# Patient Record
Sex: Male | Born: 1935 | Race: White | Hispanic: No | Marital: Married | Smoking: Former smoker
Health system: Southern US, Community
[De-identification: ages and names within clinical notes are randomized; demographics above are authoritative.]

## PROBLEM LIST (undated history)

## (undated) DIAGNOSIS — Z87448 Personal history of other diseases of urinary system: Secondary | ICD-10-CM

## (undated) DIAGNOSIS — D494 Neoplasm of unspecified behavior of bladder: Secondary | ICD-10-CM

## (undated) DIAGNOSIS — C61 Malignant neoplasm of prostate: Secondary | ICD-10-CM

## (undated) DIAGNOSIS — Z96649 Presence of unspecified artificial hip joint: Secondary | ICD-10-CM

## (undated) DIAGNOSIS — I509 Heart failure, unspecified: Secondary | ICD-10-CM

## (undated) DIAGNOSIS — M1990 Unspecified osteoarthritis, unspecified site: Secondary | ICD-10-CM

## (undated) HISTORY — DX: Unspecified osteoarthritis, unspecified site: M19.90

## (undated) HISTORY — PX: HERNIA REPAIR: SHX51

## (undated) HISTORY — DX: Presence of unspecified artificial hip joint: Z96.649

## (undated) HISTORY — DX: Heart failure, unspecified: I50.9

## (undated) HISTORY — DX: Personal history of other diseases of urinary system: Z87.448

## (undated) HISTORY — DX: Neoplasm of unspecified behavior of bladder: D49.4

## (undated) HISTORY — PX: TOTAL HIP ARTHROPLASTY: SHX124

---

## 1962-10-08 DIAGNOSIS — Z96649 Presence of unspecified artificial hip joint: Secondary | ICD-10-CM

## 1962-10-08 HISTORY — DX: Presence of unspecified artificial hip joint: Z96.649

## 2013-11-04 ENCOUNTER — Ambulatory Visit: Payer: Self-pay | Admitting: Internal Medicine

## 2013-11-10 ENCOUNTER — Encounter: Payer: Self-pay | Admitting: Internal Medicine

## 2013-11-10 ENCOUNTER — Ambulatory Visit (INDEPENDENT_AMBULATORY_CARE_PROVIDER_SITE_OTHER): Payer: Medicare HMO | Admitting: Internal Medicine

## 2013-11-10 VITALS — BP 144/80 | HR 80 | Temp 98.0°F | Ht 67.0 in | Wt 132.0 lb

## 2013-11-10 DIAGNOSIS — N400 Enlarged prostate without lower urinary tract symptoms: Secondary | ICD-10-CM

## 2013-11-10 DIAGNOSIS — M1990 Unspecified osteoarthritis, unspecified site: Secondary | ICD-10-CM | POA: Insufficient documentation

## 2013-11-10 DIAGNOSIS — R21 Rash and other nonspecific skin eruption: Secondary | ICD-10-CM

## 2013-11-10 DIAGNOSIS — M129 Arthropathy, unspecified: Secondary | ICD-10-CM

## 2013-11-10 DIAGNOSIS — Z96649 Presence of unspecified artificial hip joint: Secondary | ICD-10-CM

## 2013-11-10 MED ORDER — CICLOPIROX OLAMINE 0.77 % EX CREA: TOPICAL_CREAM | Freq: Two times a day (BID) | CUTANEOUS | Status: DC
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
## 2013-11-10 NOTE — Patient Instructions (Addendum)
Would like dermatology to see you about the rash but you can decide this[facility]   In this office park is helpful. If you change your mind  Can get there prevnar 13  Vaccine.   Will refill medication  Topical   Get a copy of last 2 years of  Office notes labs studies  medical records and any immunizations done .    Fall Prevention and Home Safety Falls cause injuries and can affect all age groups. It is possible to use preventive measures to significantly decrease the likelihood of falls. There are many simple measures which can make your home safer and prevent falls. OUTDOORS  Repair cracks and edges of walkways and driveways.  Remove high doorway thresholds.  Trim shrubbery on the main path into your home.  Have good outside lighting.  Clear walkways of tools, rocks, debris, and clutter.  Check that handrails are not broken and are securely fastened. Both sides of steps should have handrails.  Have leaves, snow, and ice cleared regularly.  Use sand or salt on walkways during winter months.  In the garage, clean up grease or oil spills. BATHROOM  Install night lights.  Install grab bars by the toilet and in the tub and shower.  Use non-skid mats or decals in the tub or shower.  Place a plastic non-slip stool in the shower to sit on, if needed.  Keep floors dry and clean up all water on the floor immediately.  Remove soap buildup in the tub or shower on a regular basis.  Secure bath mats with non-slip, double-sided rug tape.  Remove throw rugs and tripping hazards from the floors. BEDROOMS  Install night lights.  Make sure a bedside light is easy to reach.  Do not use oversized bedding.  Keep a telephone by your bedside.  Have a firm chair with side arms to use for getting dressed.  Remove throw rugs and tripping hazards from the floor. KITCHEN  Keep handles on pots and pans turned toward the center of the stove. Use back burners when  possible.  Clean up spills quickly and allow time for drying.  Avoid walking on wet floors.  Avoid hot utensils and knives.  Position shelves so they are not too high or low.  Place commonly used objects within easy reach.  If necessary, use a sturdy step stool with a grab bar when reaching.  Keep electrical cables out of the way.  Do not use floor polish or wax that makes floors slippery. If you must use wax, use non-skid floor wax.  Remove throw rugs and tripping hazards from the floor. STAIRWAYS  Never leave objects on stairs.  Place handrails on both sides of stairways and use them. Fix any loose handrails. Make sure handrails on both sides of the stairways are as long as the stairs.  Check carpeting to make sure it is firmly attached along stairs. Make repairs to worn or loose carpet promptly.  Avoid placing throw rugs at the top or bottom of stairways, or properly secure the rug with carpet tape to prevent slippage. Get rid of throw rugs, if possible.  Have an electrician put in a light switch at the top and bottom of the stairs. OTHER FALL PREVENTION TIPS  Wear low-heel or rubber-soled shoes that are supportive and fit well. Wear closed toe shoes.  When using a stepladder, make sure it is fully opened and both spreaders are firmly locked. Do not climb a closed stepladder.  Add color or contrast paint or tape to grab bars and handrails in your home. Place contrasting color strips on first and last steps.  Learn and use mobility aids as needed. Install an electrical emergency response system.  Turn on lights to avoid dark areas. Replace light bulbs that burn out immediately. Get light switches that glow.  Arrange furniture to create clear pathways. Keep furniture in the same place.  Firmly attach carpet with non-skid or double-sided tape.  Eliminate uneven floor surfaces.  Select a carpet pattern that does not visually hide the edge of steps.  Be aware of all  pets. OTHER HOME SAFETY TIPS  Set the water temperature for 120 F (48.8 C).  Keep emergency numbers on or near the telephone.  Keep smoke detectors on every level of the home and near sleeping areas. Document Released: 09/14/2002 Document Revised: 03/25/2012 Document Reviewed: 12/14/2011 [facility] Patient Information 2014 [place].

## 2014-02-19 ENCOUNTER — Telehealth: Payer: Self-pay

## 2014-02-19 ENCOUNTER — Encounter: Payer: Self-pay | Admitting: Physician Assistant

## 2014-02-19 ENCOUNTER — Ambulatory Visit (INDEPENDENT_AMBULATORY_CARE_PROVIDER_SITE_OTHER): Payer: Commercial Managed Care - HMO | Admitting: Physician Assistant

## 2014-02-19 VITALS — BP 110/80 | HR 91 | Temp 97.6°F | Resp 18 | Wt 128.0 lb

## 2014-02-19 DIAGNOSIS — L0390 Cellulitis, unspecified: Secondary | ICD-10-CM

## 2014-02-19 DIAGNOSIS — L0291 Cutaneous abscess, unspecified: Secondary | ICD-10-CM

## 2014-02-19 MED ORDER — DOXYCYCLINE HYCLATE 100 MG PO CAPS: 100.0000 mg | ORAL_CAPSULE | Freq: Two times a day (BID) | ORAL | Status: DC
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
## 2014-02-19 NOTE — Patient Instructions (Signed)
Doxycycline twice daily for 10 days due to pustule presence with cellulitis.  Follow up in 1 week, or sooner if symptoms worsen or fail to improve despite treatment.   Cellulitis Cellulitis is an infection of the skin and the tissue under the skin. The infected area is usually red and tender. This happens most often in the arms and lower legs. HOME CARE   Take your antibiotic medicine as told. Finish the medicine even if you start to feel better.  Keep the infected arm or leg raised (elevated).  Put a warm cloth on the area up to 4 times per day.  Only take medicines as told by your doctor.  Keep all doctor visits as told. GET HELP RIGHT AWAY IF:   You have a fever.  You feel very sleepy.  You throw up (vomit) or have watery poop (diarrhea).  You feel sick and have muscle aches and pains.  You see red streaks on the skin coming from the infected area.  Your red area gets bigger or turns a dark color.  Your bone or joint under the infected area is painful after the skin heals.  Your infection comes back in the same area or different area.  You have a puffy (swollen) bump in the infected area.  You have new symptoms. MAKE SURE YOU:   Understand these instructions.  Will watch your condition.  Will get help right away if you are not doing well or get worse. Document Released: 03/12/2008 Document Revised: 03/25/2012 Document Reviewed: 12/10/2011 [facility] Patient Information 2014 [place], [place].

## 2014-02-19 NOTE — Telephone Encounter (Signed)
Pt's daughter and wife came into office about pt's medication.  Per daughter they went to [facility] [address] and was told that the medication was not there [facility] [place]. Called [facility] [place] and spoke with Otila Kluver and was told that pt's medication was ready but it was self pay until pt's new insurance card was received.  Spoke with daughter and wife and they are aware.   Pt's daughter states that pt is still concerned about the cellulitis on his leg; it is concerned that there is a tick in there and they would like a call from [place].  Pls call Pamala Hurry.

## 2014-02-19 NOTE — Telephone Encounter (Signed)
Called, Spoke with daughter. She understands the current plan and agrees with it. They will follow up next week for a recheck.

## 2014-02-19 NOTE — Progress Notes (Signed)
Pre visit review using our clinic review tool, if applicable. No additional management support is needed unless otherwise documented below in the visit note. 

## 2014-02-23 ENCOUNTER — Encounter: Payer: Self-pay | Admitting: Internal Medicine

## 2014-02-23 ENCOUNTER — Ambulatory Visit (INDEPENDENT_AMBULATORY_CARE_PROVIDER_SITE_OTHER): Payer: Commercial Managed Care - HMO | Admitting: Internal Medicine

## 2014-02-23 VITALS — BP 136/76 | Temp 97.6°F | Ht 67.0 in | Wt 131.0 lb

## 2014-02-23 DIAGNOSIS — R21 Rash and other nonspecific skin eruption: Secondary | ICD-10-CM

## 2014-02-23 DIAGNOSIS — L0291 Cutaneous abscess, unspecified: Secondary | ICD-10-CM

## 2014-02-23 DIAGNOSIS — N400 Enlarged prostate without lower urinary tract symptoms: Secondary | ICD-10-CM

## 2014-02-23 DIAGNOSIS — L0390 Cellulitis, unspecified: Secondary | ICD-10-CM

## 2014-02-23 MED ORDER — TAMSULOSIN HCL 0.4 MG PO CAPS: 0.4000 mg | ORAL_CAPSULE | Freq: Two times a day (BID) | ORAL | Status: DC
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
## 2014-02-23 NOTE — Patient Instructions (Addendum)
The skin infection looks a lot better  Ok to take with food if needed. And plenty of water to avoid stomach upset.  Still uncertain  the skin area .    On the back   Would like you to see .  Dermatology to make sure no early cancers in on your back.  Advise  You to get prevnar 13 .,   Ok to do.  urology referral. If needed    For your prostate problem and medication.  Since  No significant sx we can continue medication at this time.   ROV 6 months

## 2014-03-15 ENCOUNTER — Telehealth: Payer: Self-pay | Admitting: Family Medicine

## 2014-03-15 DIAGNOSIS — R413 Other amnesia: Secondary | ICD-10-CM

## 2014-03-15 NOTE — Telephone Encounter (Signed)
Spoke to [place] (daughter).  She reports the patient has been having a lot of memory issues.  She can have a conversation with him and within 10 minutes he has forgotten what they talked about.  She says he has no interest in doing things.  The things he use to do during the day, he no longer does.  Mostly just sits around.  She said he has problems making decisions.  Even small ones.  She would like to know if this could be due to the medications that he is one or is this something else.  Please advise.  Thanks!

## 2014-03-15 NOTE — Telephone Encounter (Signed)
I don't think its the medicine . Depression can do this or other brain problems .  Advise we get neurology consult

## 2014-03-16 NOTE — Telephone Encounter (Signed)
Spoke to [place].  Advised that Dr. Regis Bill does not think it is his medications and she would like for him to be seen by neuro. Pamala Hurry agrees.  Will place referral.

## 2014-03-18 ENCOUNTER — Telehealth: Payer: Self-pay | Admitting: Internal Medicine

## 2014-03-18 NOTE — Telephone Encounter (Signed)
Pt's daughter Thuan Tippett) calling on behalf of pt to have appt to neurologist rescheduled.  Apparently, the patient cancelled the appt when the office call to inform him of the date and time.  Daughter was given the number to [facility] to reschedule appt.  Ms. Teal wants pt's PCP to be aware of this.

## 2014-03-19 ENCOUNTER — Ambulatory Visit (INDEPENDENT_AMBULATORY_CARE_PROVIDER_SITE_OTHER): Payer: Commercial Managed Care - HMO | Admitting: Internal Medicine

## 2014-03-19 ENCOUNTER — Encounter: Payer: Self-pay | Admitting: Internal Medicine

## 2014-03-19 VITALS — BP 154/74 | Temp 98.2°F | Wt 130.0 lb

## 2014-03-19 DIAGNOSIS — T148 Other injury of unspecified body region: Secondary | ICD-10-CM

## 2014-03-19 DIAGNOSIS — W57XXXA Bitten or stung by nonvenomous insect and other nonvenomous arthropods, initial encounter: Secondary | ICD-10-CM

## 2014-03-19 DIAGNOSIS — Z23 Encounter for immunization: Secondary | ICD-10-CM

## 2014-03-19 MED ORDER — DOXYCYCLINE HYCLATE 100 MG PO TABS: 100.0000 mg | ORAL_TABLET | Freq: Two times a day (BID) | ORAL | Status: DC

## 2014-03-19 NOTE — Patient Instructions (Addendum)
This looks like an insect  Bite   Not typical of   Tick bite . Although you may have had a tick bite . This is not a deer tick dont think this is lyme  If you get   summer flu sx   With fever.   Seek emergent  Care .    If the  The lesion gets worse  Pain and swelling  Then add the antibiotic again . Update your tetanus shot today .  Spider Bite Spider bites are not common. Most spider bites do not cause serious problems. The elderly, very young children, and people with certain existing medical conditions are more likely to experience significant symptoms. SYMPTOMS  Spider bites may not cause any pain at first. Within 1 or 2 days of the bite, there may be swelling, redness, and pain in the bite area. However, some spider bites can cause pain within the first hour. TREATMENT  Your caregiver may prescribe antibiotic medicine if a bacterial infection develops in the bite. However, not all spider bites require antibiotics or prescription medicines.  HOME CARE INSTRUCTIONS  Do not scratch the bite area.  Keep the bite area clean and dry. Wash the area with soap and water as directed.  Put ice or cool compresses on the bite area.  Put ice in a plastic bag.  Place a towel between your skin and the bag.  Leave the ice on for 20 minutes, 4 times a day for the first 2 to 3 days, or as directed.  Keep the bite area elevated above the level of your heart. This helps reduce redness and swelling.  Only take over-the-counter or prescription medicines as directed by your caregiver.  If you are given antibiotics, take them as directed. Finish them even if you start to feel better. You may need a tetanus shot if:  You cannot remember when you had your last tetanus shot.  You have never had a tetanus shot.  The injury broke your skin. If you get a tetanus shot, your arm may swell, get red, and feel warm to the touch. This is common and not a problem. If you need a tetanus shot and you choose not  to have one, there is a rare chance of getting tetanus. Sickness from tetanus can be serious. SEEK MEDICAL CARE IF: Your bite is not better after 3 days of treatment. SEEK IMMEDIATE MEDICAL CARE IF:  Your bite turns purple or develops increased swelling, pain, or redness.  You develop shortness of breath or chest pain.  You have muscle cramps or painful muscle spasms.  You develop abdominal pain, nausea, or vomiting.  You feel unusually tired or sleepy. MAKE SURE YOU:  Understand these instructions.  Will watch your condition.  Will get help right away if you are not doing well or get worse. Document Released: 11/01/2004 Document Revised: 12/17/2011 Document Reviewed: 04/25/2011 [facility] Patient Information 2014 [place].  Tick Bite Information Ticks are insects that attach themselves to the skin and draw blood for food. There are various types of ticks. Common types include wood ticks and deer ticks. Most ticks live in shrubs and grassy areas. Ticks can climb onto your body when you make contact with leaves or grass where the tick is waiting. The most common places on the body for ticks to attach themselves are the scalp, neck, armpits, waist, and groin. Most tick bites are harmless, but sometimes ticks carry germs that cause diseases. These germs can be spread  to a person during the tick's feeding process. The chance of a disease spreading through a tick bite depends on:   The type of tick.  Time of year.   How long the tick is attached.   Geographic location.  HOW CAN YOU PREVENT TICK BITES? Take these steps to help prevent tick bites when you are outdoors:  Wear protective clothing. Long sleeves and long pants are best.   Wear white clothes so you can see ticks more easily.  Tuck your pant legs into your socks.   If walking on a trail, stay in the middle of the trail to avoid brushing against bushes.  Avoid walking through areas with long grass.  Put  insect repellent on all exposed skin and along boot tops, pant legs, and sleeve cuffs.   Check clothing, hair, and skin repeatedly and before going inside.   Brush off any ticks that are not attached.  Take a shower or bath as soon as possible after being outdoors.  WHAT IS THE PROPER WAY TO REMOVE A TICK? Ticks should be removed as soon as possible to help prevent diseases caused by tick bites. 1. If latex gloves are available, put them on before trying to remove a tick.  2. Using fine-point tweezers, grasp the tick as close to the skin as possible. You may also use curved forceps or a tick removal tool. Grasp the tick as close to its head as possible. Avoid grasping the tick on its body. 3. Pull gently with steady upward pressure until the tick lets go. Do not twist the tick or jerk it suddenly. This may break off the tick's head or mouth parts. 4. Do not squeeze or crush the tick's body. This could force disease-carrying fluids from the tick into your body.  5. After the tick is removed, wash the bite area and your hands with soap and water or other disinfectant such as alcohol. 6. Apply a small amount of antiseptic cream or ointment to the bite site.  7. Wash and disinfect any instruments that were used.  Do not try to remove a tick by applying a hot match, petroleum jelly, or fingernail polish to the tick. These methods do not work and may increase the chances of disease being spread from the tick bite.  WHEN SHOULD YOU SEEK MEDICAL CARE? Contact your health care provider if you are unable to remove a tick from your skin or if a part of the tick breaks off and is stuck in the skin.  After a tick bite, you need to be aware of signs and symptoms that could be related to diseases spread by ticks. Contact your health care provider if you develop any of the following in the days or weeks after the tick bite:  Unexplained fever.  Rash. A circular rash that appears days or weeks after  the tick bite may indicate the possibility of Lyme disease. The rash may resemble a target with a bull's-eye and may occur at a different part of your body than the tick bite.  Redness and swelling in the area of the tick bite.   Tender, swollen lymph glands.   Diarrhea.   Weight loss.   Cough.   Fatigue.   Muscle, joint, or bone pain.   Abdominal pain.   Headache.   Lethargy or a change in your level of consciousness.  Difficulty walking or moving your legs.   Numbness in the legs.   Paralysis.  Shortness of breath.  Confusion.   Repeated vomiting.  Document Released: 09/21/2000 Document Revised: 07/15/2013 Document Reviewed: 03/04/2013 [facility] Patient Information 2014 [place].
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
## 2014-08-02 ENCOUNTER — Telehealth: Payer: Self-pay | Admitting: Internal Medicine

## 2014-08-02 DIAGNOSIS — R413 Other amnesia: Secondary | ICD-10-CM

## 2014-08-02 NOTE — Telephone Encounter (Signed)
Order placed in the sytem. 

## 2014-08-02 NOTE — Telephone Encounter (Signed)
Per pt  Daughter call need a new referral for Memory problem Procedure: REF46 - AMB REFERRAL TO NEUROLOGY Back in June the patient did not want to schedule at that time  , now he does need new referral - the old one expired

## 2014-08-24 ENCOUNTER — Ambulatory Visit: Payer: Commercial Managed Care - HMO | Admitting: Internal Medicine

## 2014-09-07 ENCOUNTER — Telehealth: Payer: Self-pay | Admitting: Neurology

## 2014-09-07 NOTE — Telephone Encounter (Signed)
Pt called and canceled appt new patient appt and did not resch and does not feel like he needs to come in dr Regis Bill was notified

## 2014-09-08 ENCOUNTER — Ambulatory Visit: Payer: Commercial Managed Care - HMO | Admitting: Neurology

## 2014-12-13 ENCOUNTER — Telehealth: Payer: Self-pay | Admitting: Internal Medicine

## 2014-12-13 MED ORDER — FINASTERIDE 5 MG PO TABS: 5.0000 mg | ORAL_TABLET | Freq: Every day | ORAL | Status: DC

## 2014-12-13 NOTE — Telephone Encounter (Signed)
Ok to refill x 6 months 

## 2014-12-13 NOTE — Telephone Encounter (Signed)
Sent to the pharmacy by e-scribe. 

## 2014-12-13 NOTE — Telephone Encounter (Signed)
Pt needs new rx finasteride 5 mg #90 w/refills sent to [facility] order. This med was last prescribe by his last md

## 2015-01-31 ENCOUNTER — Telehealth: Payer: Self-pay | Admitting: Internal Medicine

## 2015-01-31 DIAGNOSIS — Z7689 Persons encountering health services in other specified circumstances: Secondary | ICD-10-CM

## 2015-01-31 NOTE — Telephone Encounter (Signed)
Referral placed in the system. 

## 2015-01-31 NOTE — Telephone Encounter (Addendum)
Pt would like to referral to dr Barkley Bruns [phone] for toenail cutting and callus on foot. Can we refer? Pt has [employer]

## 2015-02-28 ENCOUNTER — Encounter: Payer: Self-pay | Admitting: Podiatry

## 2015-02-28 ENCOUNTER — Ambulatory Visit (INDEPENDENT_AMBULATORY_CARE_PROVIDER_SITE_OTHER): Payer: Commercial Managed Care - HMO | Admitting: Podiatry

## 2015-02-28 VITALS — BP 131/67 | HR 87 | Temp 99.0°F | Resp 16

## 2015-02-28 DIAGNOSIS — Q828 Other specified congenital malformations of skin: Secondary | ICD-10-CM

## 2015-02-28 DIAGNOSIS — M2040 Other hammer toe(s) (acquired), unspecified foot: Secondary | ICD-10-CM | POA: Diagnosis not present

## 2015-02-28 NOTE — Progress Notes (Signed)
   Subjective:    Patient ID: Sergio Dennis, male    DOB: 1925-05-18, 81 y.o.   MRN: [id number]  HPI  N: Dull L: Right bottom foot D: 6+ months O: Sudden C: more irritating than anything A: When walking or certain shoes T: Nothing   Review of Systems  All other systems reviewed and are negative.      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 Debridement of keratoses 4 without any bleeding Attached felt metatarsal pads in patient's shoe insoles to see if this would would provide some relief in the MPJ area If this provider relief I recommended over-the-counter soft insole with a metatarsal raise He also requested handicap parking sticker at discharged and provided him a request for sticker 12 months based on painful plantar keratoses making walking uncomfortable at times After patient was discharged he requested nail debridement. I advised him to have nails trimmed at pedicurist  Reappoint at patient's request

## 2015-02-28 NOTE — Patient Instructions (Signed)
First wear the shoes with a metatarsal raises see if you can feel the difference Okay to try over-the-counter soft flexible shoe insole with soft metatarsal raise  Hammer Toes Hammer toes is a condition in which one or more of your toes is permanently flexed. CAUSES  This happens when a muscle imbalance or abnormal bone length makes your small toes buckle. This causes the toe joint to contract and the strong cord-like bands that attach muscles to the bones (tendons) in your toes to shorten.  SIGNS AND SYMPTOMS  Common symptoms of flexible hammer toes include:   A buildup of skin cells (corns). Corns occur where boney bumps come in frequent contact with hard surfaces. For example, where your shoes press and rub.  Irritation.  Inflammation.  Pain.  Limited motion in your toes. DIAGNOSIS  Hammer toes are diagnosed through a physical exam of your toes. During the exam, your health care provider may try to reproduce your symptoms by manipulating your foot. Often, X-ray exams are done to determine the degree of deformity and to make sure that the cause is not a fracture.  TREATMENT  Hammer toes can be treated with corrective surgery. There are several types of surgical procedures that can treat hammer toes. The most common procedures include:  Arthroplasty--A portion of the joint is surgically removed and your toe is straightened. The gap fills in with fibrous tissue. This procedure helps treat pain and deformity and helps restore function.  Fusion--Cartilage between the two bones of the affected joint is taken out and the bones fuse together into one longer bone. This helps keep your toe stable and reduces pain but leaves your toe stiff, yet straight.  Implantation--A portion of your bone is removed and replaced with an implant to restore motion.  Flexor tendon transfers--This procedure repositions the tendons that curl the toes down (flexor tendons). This may be done to release the  deforming force that causes your toe to buckle. Several of these procedures require fixing your toe with a pin that is visible at the tip of your toe. The pin keeps the toe straight during healing. Your health care provider will remove the pin usually within 4-8 weeks after the procedure.  Document Released: 09/21/2000 Document Revised: 09/29/2013 Document Reviewed: 06/01/2013 [facility] Patient Information 2015 [place], [place]. This information is not intended to replace advice given to you by your health care provider. Make sure you discuss any questions you have with your health care provider.

## 2015-03-01 ENCOUNTER — Encounter: Payer: Self-pay | Admitting: Podiatry

## 2015-03-08 ENCOUNTER — Telehealth: Payer: Self-pay | Admitting: Internal Medicine

## 2015-03-08 NOTE — Telephone Encounter (Signed)
Noted FYI to [facility]

## 2015-03-08 NOTE — Telephone Encounter (Signed)
[facility] - Client [place] [facility] Patient Name: Sergio Dennis DOB: [birth year] Initial Comment Caller states father has a rash all over and lips and checks are swollen. Allergic reaction to salmon. Nurse Assessment Nurse: Donalynn Furlong, RN, Myna Hidalgo Date/Time Eilene Ghazi Time): 03/08/2015 4:09:52 PM Confirm and document reason for call. If symptomatic, describe symptoms. ---Caller states father has a rash all over and lips and checks are swollen. Allergic reaction to salmon. This happened Saturday night. No difficulty breathing or swallowing Saturday night or today. Apparently, according to the caller, the swelling today is 1/2 of what is was on Sunday morning, and getting increasingly better. "My father is doing much better now than he was this weekend". Pt is comfortable, speaking, swallowing and breathing with ease. no other s/s to report at this time. Daughter has given pt Benedryl today, "just in case". Has the patient traveled out of the country within the last 30 days? ---No Does the patient require triage? ---Yes Related visit to physician within the last 2 weeks? ---No Does the PT have any chronic conditions? (i.e. diabetes, asthma, etc.) ---No Guidelines Guideline Title Affirmed Question Affirmed Notes Face Swelling Mild facial swelling of unknown cause (all triage questions negative) Final Disposition User [place], RN, Myna Hidalgo

## 2015-03-09 ENCOUNTER — Ambulatory Visit (INDEPENDENT_AMBULATORY_CARE_PROVIDER_SITE_OTHER): Payer: Commercial Managed Care - HMO | Admitting: Family Medicine

## 2015-03-09 ENCOUNTER — Ambulatory Visit: Payer: Commercial Managed Care - HMO | Admitting: Internal Medicine

## 2015-03-09 ENCOUNTER — Encounter: Payer: Self-pay | Admitting: Family Medicine

## 2015-03-09 VITALS — BP 116/82 | HR 95 | Temp 98.1°F | Ht 67.0 in | Wt 129.9 lb

## 2015-03-09 DIAGNOSIS — T783XXA Angioneurotic edema, initial encounter: Secondary | ICD-10-CM | POA: Diagnosis not present

## 2015-03-09 DIAGNOSIS — T7840XA Allergy, unspecified, initial encounter: Secondary | ICD-10-CM

## 2015-03-09 MED ORDER — PREDNISONE 20 MG PO TABS: 20.0000 mg | ORAL_TABLET | Freq: Every day | ORAL | Status: DC

## 2015-03-09 NOTE — Progress Notes (Signed)
Pre visit review using our clinic review tool, if applicable. No additional management support is needed unless otherwise documented below in the visit note. 
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
## 2015-03-09 NOTE — Patient Instructions (Addendum)
Take the prednisone once daily  Pepcid (available over the counter) once daily until you see the allergist  We placed a referral for you as discussed to the allergist. Please ensure you keep this appointment.  Schedule follow up with Dr. Regis Bill  in 2-4 weeks or sooner if any concerns  If ANY recurrent facial swelling, trouble breathing, trouble swallowing hives with nausea, vomiting or diarrhea or recurrent serious reacion seek emergency care immediately - call 911

## 2015-03-14 ENCOUNTER — Other Ambulatory Visit: Payer: Self-pay | Admitting: Internal Medicine

## 2015-03-16 NOTE — Telephone Encounter (Signed)
Sent to the pharmacy by e-scribe.  Pt has upcoming wellness on 06/08/15.

## 2015-04-13 ENCOUNTER — Telehealth: Payer: Self-pay | Admitting: Internal Medicine

## 2015-04-13 DIAGNOSIS — R4189 Other symptoms and signs involving cognitive functions and awareness: Secondary | ICD-10-CM

## 2015-04-13 DIAGNOSIS — R413 Other amnesia: Secondary | ICD-10-CM

## 2015-04-13 NOTE — Telephone Encounter (Signed)
daughter Pamala Hurry called to get referral for pt to see neurologist.  Pt has finally decided he will go . They could not get him in the past. pls call Pamala Hurry, as she is on the [facility].

## 2015-04-13 NOTE — Telephone Encounter (Signed)
Left a message on listed number for Sergio Dennis to return my call.

## 2015-04-14 NOTE — Telephone Encounter (Signed)
Pt has agreed to finally see a neurologist to be evaluated for dementia.  Sergio Dennis states the pt is getting worse.  Okay to place referral?

## 2015-04-14 NOTE — Telephone Encounter (Signed)
Left a message for Barbara to return my call.

## 2015-04-15 NOTE — Telephone Encounter (Signed)
Referral placed in the system. 

## 2015-04-15 NOTE — Telephone Encounter (Signed)
yes

## 2015-05-05 ENCOUNTER — Encounter: Payer: Self-pay | Admitting: Internal Medicine

## 2015-05-05 ENCOUNTER — Ambulatory Visit (INDEPENDENT_AMBULATORY_CARE_PROVIDER_SITE_OTHER): Payer: Commercial Managed Care - HMO | Admitting: Internal Medicine

## 2015-05-05 VITALS — BP 132/70 | Temp 97.5°F | Ht 67.0 in | Wt 130.6 lb

## 2015-05-05 DIAGNOSIS — M25471 Effusion, right ankle: Secondary | ICD-10-CM | POA: Diagnosis not present

## 2015-05-05 DIAGNOSIS — R2689 Other abnormalities of gait and mobility: Secondary | ICD-10-CM | POA: Diagnosis not present

## 2015-05-05 DIAGNOSIS — M25571 Pain in right ankle and joints of right foot: Secondary | ICD-10-CM

## 2015-05-05 DIAGNOSIS — L089 Local infection of the skin and subcutaneous tissue, unspecified: Secondary | ICD-10-CM

## 2015-05-05 MED ORDER — DOXYCYCLINE HYCLATE 100 MG PO TABS: 100.0000 mg | ORAL_TABLET | Freq: Two times a day (BID) | ORAL | Status: DC

## 2015-05-05 NOTE — Patient Instructions (Addendum)
Uncertain what caused this . But concern about infection    Subtle  injury . Get x ray and begin antibiotic   As directed   If not a lot better   In the next 5-7  days  Want you to see orthopedist or  Sports medicine .   Perhaps Dr.  Tamala Julian.
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
## 2015-05-11 ENCOUNTER — Other Ambulatory Visit: Payer: Self-pay | Admitting: Internal Medicine

## 2015-05-11 NOTE — Telephone Encounter (Signed)
Sent to the pharmacy by e-scribe.  Pt has upcoming medicare wellness on 06/08/15

## 2015-05-12 ENCOUNTER — Ambulatory Visit (INDEPENDENT_AMBULATORY_CARE_PROVIDER_SITE_OTHER)
Admission: RE | Admit: 2015-05-12 | Discharge: 2015-05-12 | Disposition: A | Discharge status: Home / Self Care | Payer: Commercial Managed Care - HMO | Source: Ambulatory Visit | Attending: Internal Medicine | Admitting: Internal Medicine

## 2015-05-12 DIAGNOSIS — M25571 Pain in right ankle and joints of right foot: Secondary | ICD-10-CM

## 2015-05-12 DIAGNOSIS — M25471 Effusion, right ankle: Secondary | ICD-10-CM

## 2015-06-01 ENCOUNTER — Ambulatory Visit: Payer: Commercial Managed Care - HMO | Admitting: Neurology

## 2015-06-08 ENCOUNTER — Ambulatory Visit (INDEPENDENT_AMBULATORY_CARE_PROVIDER_SITE_OTHER): Payer: Commercial Managed Care - HMO | Admitting: Internal Medicine

## 2015-06-08 DIAGNOSIS — M7989 Other specified soft tissue disorders: Secondary | ICD-10-CM

## 2015-06-08 NOTE — Progress Notes (Signed)
Patient came  In late for appt  So not seen for medicare wellness  To ressced   howver concern about leg still swollen  Feels like thorns are still in his   Foot when steps .   No fever redness   Swelling  Up to  Almost right knee  Looks well   RLE 2+ edema with vv and no rednss or cords    lle less edema vv    Assymmetrical swelling  And hx of cellulitis right ankle   Send for doppler of leg and then plan ROV

## 2015-06-10 ENCOUNTER — Ambulatory Visit (HOSPITAL_COMMUNITY)
Admission: RE | Admit: 2015-06-10 | Discharge: 2015-06-10 | Disposition: A | Discharge status: Home / Self Care | Payer: Commercial Managed Care - HMO | Source: Ambulatory Visit | Attending: Urology | Admitting: Urology

## 2015-06-10 DIAGNOSIS — F17200 Nicotine dependence, unspecified, uncomplicated: Secondary | ICD-10-CM | POA: Diagnosis not present

## 2015-06-10 DIAGNOSIS — M7989 Other specified soft tissue disorders: Secondary | ICD-10-CM | POA: Diagnosis not present

## 2015-06-11 DIAGNOSIS — M7989 Other specified soft tissue disorders: Secondary | ICD-10-CM | POA: Insufficient documentation

## 2015-06-11 NOTE — Patient Instructions (Signed)
Pt told to get doppler and then make fu wvisit with me

## 2015-07-11 ENCOUNTER — Ambulatory Visit (INDEPENDENT_AMBULATORY_CARE_PROVIDER_SITE_OTHER): Payer: Commercial Managed Care - HMO | Admitting: Internal Medicine

## 2015-07-11 ENCOUNTER — Encounter: Payer: Self-pay | Admitting: Internal Medicine

## 2015-07-11 ENCOUNTER — Encounter: Payer: Commercial Managed Care - HMO | Admitting: Internal Medicine

## 2015-07-11 VITALS — BP 106/72 | Temp 98.4°F | Ht 67.5 in | Wt 137.7 lb

## 2015-07-11 DIAGNOSIS — Z2820 Immunization not carried out because of patient decision for unspecified reason: Secondary | ICD-10-CM | POA: Diagnosis not present

## 2015-07-11 DIAGNOSIS — N400 Enlarged prostate without lower urinary tract symptoms: Secondary | ICD-10-CM

## 2015-07-11 DIAGNOSIS — Z0000 Encounter for general adult medical examination without abnormal findings: Secondary | ICD-10-CM | POA: Diagnosis not present

## 2015-07-11 DIAGNOSIS — L989 Disorder of the skin and subcutaneous tissue, unspecified: Secondary | ICD-10-CM | POA: Diagnosis not present

## 2015-07-11 DIAGNOSIS — Z2821 Immunization not carried out because of patient refusal: Secondary | ICD-10-CM | POA: Diagnosis not present

## 2015-07-11 LAB — CBC WITH DIFFERENTIAL/PLATELET
Basophils Absolute: 0 10*3/uL (ref 0.0–0.1)
Basophils Relative: 0.5 % (ref 0.0–3.0)
EOS ABS: 0.1 10*3/uL (ref 0.0–0.7)
Eosinophils Relative: 1 % (ref 0.0–5.0)
HEMATOCRIT: 36.3 % — AB (ref 39.0–52.0)
HEMOGLOBIN: 12.1 g/dL — AB (ref 13.0–17.0)
Lymphocytes Relative: 21.7 % (ref 12.0–46.0)
Lymphs Abs: 1.1 10*3/uL (ref 0.7–4.0)
MCHC: 33.3 g/dL (ref 30.0–36.0)
MCV: 93.4 fl (ref 78.0–100.0)
Monocytes Absolute: 0.5 10*3/uL (ref 0.1–1.0)
Monocytes Relative: 10.2 % (ref 3.0–12.0)
Neutro Abs: 3.5 10*3/uL (ref 1.4–7.7)
Neutrophils Relative %: 66.6 % (ref 43.0–77.0)
Platelets: 138 10*3/uL — ABNORMAL LOW (ref 150.0–400.0)
RBC: 3.89 Mil/uL — ABNORMAL LOW (ref 4.22–5.81)
RDW: 14.6 % (ref 11.5–15.5)
WBC: 5.2 10*3/uL (ref 4.0–10.5)

## 2015-07-11 LAB — POCT URINALYSIS DIP (MANUAL ENTRY)
BILIRUBIN UA: NEGATIVE
BILIRUBIN UA: NEGATIVE
GLUCOSE UA: NEGATIVE
Leukocytes, UA: NEGATIVE
Nitrite, UA: NEGATIVE
Protein Ur, POC: NEGATIVE
SPEC GRAV UA: 1.025
UROBILINOGEN UA: 0.2
pH, UA: 6

## 2015-07-11 LAB — HEPATIC FUNCTION PANEL
ALT: 11 U/L (ref 0–53)
AST: 22 U/L (ref 0–37)
Albumin: 3.9 g/dL (ref 3.5–5.2)
Alkaline Phosphatase: 51 U/L (ref 39–117)
BILIRUBIN TOTAL: 1.1 mg/dL (ref 0.2–1.2)
Bilirubin, Direct: 0.3 mg/dL (ref 0.0–0.3)
TOTAL PROTEIN: 7.6 g/dL (ref 6.0–8.3)

## 2015-07-11 LAB — TSH: TSH: 3.13 u[IU]/mL (ref 0.35–4.50)

## 2015-07-11 LAB — BASIC METABOLIC PANEL
BUN: 17 mg/dL (ref 6–23)
CALCIUM: 8.9 mg/dL (ref 8.4–10.5)
CO2: 29 mEq/L (ref 19–32)
CREATININE: 0.77 mg/dL (ref 0.40–1.50)
Chloride: 102 mEq/L (ref 96–112)
GFR: 101.23 mL/min (ref >60.00)
Glucose, Bld: 87 mg/dL (ref 70–99)
Potassium: 3.9 mEq/L (ref 3.5–5.1)
Sodium: 138 mEq/L (ref 135–145)

## 2015-07-11 LAB — LIPID PANEL
Cholesterol: 149 mg/dL (ref 0–200)
HDL: 45.7 mg/dL (ref >39.00)
LDL CALC: 88 mg/dL (ref 0–99)
NONHDL: 103.46
Total CHOL/HDL Ratio: 3
Triglycerides: 75 mg/dL (ref 0.0–149.0)
VLDL: 15 mg/dL (ref 0.0–40.0)
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
## 2015-07-11 NOTE — Patient Instructions (Addendum)
If the skin area  Doesn't go away on the left leg need dermatology to see you .  Get a routine eye exam very 1-2 years.  Glad you r leg is better    Health Maintenance Due  Topic Date Due  . ZOSTAVAX  01/24/1987  . PNA vac Low Risk Adult (1 of 2 - PCV13) 01/24/1992  . INFLUENZA VACCINE  05/09/2015   Health Maintenance A healthy lifestyle and preventative care can promote health and wellness.  Maintain regular health, dental, and eye exams.  Eat a healthy diet. Foods like vegetables, fruits, whole grains, low-fat dairy products, and lean protein foods contain the nutrients you need and are low in calories. Decrease your intake of foods high in solid fats, added sugars, and salt. Get information about a proper diet from your health care provider, if necessary.  Regular physical exercise is one of the most important things you can do for your health. Most adults should get at least 150 minutes of moderate-intensity exercise (any activity that increases your heart rate and causes you to sweat) each week. In addition, most adults need muscle-strengthening exercises on 2 or more days a week.   Maintain a healthy weight. The body mass index (BMI) is a screening tool to identify possible weight problems. It provides an estimate of body fat based on height and weight. Your health care provider can find your BMI and can help you achieve or maintain a healthy weight. For males 20 years and older:  A BMI below 18.5 is considered underweight.  A BMI of 18.5 to 24.9 is normal.  A BMI of 25 to 29.9 is considered overweight.  A BMI of 30 and above is considered obese.  Maintain normal blood lipids and cholesterol by exercising and minimizing your intake of saturated fat. Eat a balanced diet with plenty of fruits and vegetables. Blood tests for lipids and cholesterol should begin at age 21 and be repeated every 5 years. If your lipid or cholesterol levels are high, you are over age 35, or you are at  high risk for heart disease, you may need your cholesterol levels checked more frequently.Ongoing high lipid and cholesterol levels should be treated with medicines if diet and exercise are not working.  If you smoke, find out from your health care provider how to quit. If you do not use tobacco, do not start.  Lung cancer screening is recommended for adults aged 42-80 years who are at high risk for developing lung cancer because of a history of smoking. A yearly low-dose CT scan of the lungs is recommended for people who have at least a 30-pack-year history of smoking and are current smokers or have quit within the past 15 years. A pack year of smoking is smoking an average of 1 pack of cigarettes a day for 1 year (for example, a 30-pack-year history of smoking could mean smoking 1 pack a day for 30 years or 2 packs a day for 15 years). Yearly screening should continue until the smoker has stopped smoking for at least 15 years. Yearly screening should be stopped for people who develop a health problem that would prevent them from having lung cancer treatment.  If you choose to drink alcohol, do not have more than 2 drinks per day. One drink is considered to be 12 oz (360 mL) of beer, 5 oz (150 mL) of wine, or 1.5 oz (45 mL) of liquor.  Avoid the use of street drugs. Do not share needles  with anyone. Ask for help if you need support or instructions about stopping the use of drugs.  High blood pressure causes heart disease and increases the risk of stroke. Blood pressure should be checked at least every 1-2 years. Ongoing high blood pressure should be treated with medicines if weight loss and exercise are not effective.  If you are 60-69 years old, ask your health care provider if you should take aspirin to prevent heart disease.  Diabetes screening involves taking a blood sample to check your fasting blood sugar level. This should be done once every 3 years after age 44 if you are at a normal weight  and without risk factors for diabetes. Testing should be considered at a younger age or be carried out more frequently if you are overweight and have at least 1 risk factor for diabetes.  Colorectal cancer can be detected and often prevented. Most routine colorectal cancer screening begins at the age of 105 and continues through age 34. However, your health care provider may recommend screening at an earlier age if you have risk factors for colon cancer. On a yearly basis, your health care provider may provide home test kits to check for hidden blood in the stool. A small camera at the end of a tube may be used to directly examine the colon (sigmoidoscopy or colonoscopy) to detect the earliest forms of colorectal cancer. Talk to your health care provider about this at age 12 when routine screening begins. A direct exam of the colon should be repeated every 5-10 years through age 34, unless early forms of precancerous polyps or small growths are found.  People who are at an increased risk for hepatitis B should be screened for this virus. You are considered at high risk for hepatitis B if:  You were born in a country where hepatitis B occurs often. Talk with your health care provider about which countries are considered high risk.  Your parents were born in a high-risk country and you have not received a shot to protect against hepatitis B (hepatitis B vaccine).  You have HIV or AIDS.  You use needles to inject street drugs.  You live with, or have sex with, someone who has hepatitis B.  You are a man who has sex with other men (MSM).  You get hemodialysis treatment.  You take certain medicines for conditions like cancer, organ transplantation, and autoimmune conditions.  Hepatitis C blood testing is recommended for all people born from [birth year] through 1963 and any individual with known risk factors for hepatitis C.  Healthy men should no longer receive prostate-specific antigen (PSA) blood tests  as part of routine cancer screening. Talk to your health care provider about prostate cancer screening.  Testicular cancer screening is not recommended for adolescents or adult males who have no symptoms. Screening includes self-exam, a health care provider exam, and other screening tests. Consult with your health care provider about any symptoms you have or any concerns you have about testicular cancer.  Practice safe sex. Use condoms and avoid high-risk sexual practices to reduce the spread of sexually transmitted infections (STIs).  You should be screened for STIs, including gonorrhea and chlamydia if:  You are sexually active and are younger than 24 years.  You are older than 24 years, and your health care provider tells you that you are at risk for this type of infection.  Your sexual activity has changed since you were last screened, and you are at an increased risk  for chlamydia or gonorrhea. Ask your health care provider if you are at risk.  If you are at risk of being infected with HIV, it is recommended that you take a prescription medicine daily to prevent HIV infection. This is called pre-exposure prophylaxis (PrEP). You are considered at risk if:  You are a man who has sex with other men (MSM).  You are a heterosexual man who is sexually active with multiple partners.  You take drugs by injection.  You are sexually active with a partner who has HIV.  Talk with your health care provider about whether you are at high risk of being infected with HIV. If you choose to begin PrEP, you should first be tested for HIV. You should then be tested every 3 months for as long as you are taking PrEP.  Use sunscreen. Apply sunscreen liberally and repeatedly throughout the day. You should seek shade when your shadow is shorter than you. Protect yourself by wearing long sleeves, pants, a wide-brimmed hat, and sunglasses year round whenever you are outdoors.  Tell your health care provider of  new moles or changes in moles, especially if there is a change in shape or color. Also, tell your health care provider if a mole is larger than the size of a pencil eraser.  A one-time screening for abdominal aortic aneurysm (AAA) and surgical repair of large AAAs by ultrasound is recommended for men aged 62-75 years who are current or former smokers.  Stay current with your vaccines (immunizations). Document Released: 03/22/2008 Document Revised: 09/29/2013 Document Reviewed: 02/19/2011 [facility] Patient Information 2015 [place], [place]. This information is not intended to replace advice given to you by your health care provider. Make sure you discuss any questions you have with your health care provider.  Fall Prevention and Home Safety Falls cause injuries and can affect all age groups. It is possible to use preventive measures to significantly decrease the likelihood of falls. There are many simple measures which can make your home safer and prevent falls. OUTDOORS  Repair cracks and edges of walkways and driveways.  Remove high doorway thresholds.  Trim shrubbery on the main path into your home.  Have good outside lighting.  Clear walkways of tools, rocks, debris, and clutter.  Check that handrails are not broken and are securely fastened. Both sides of steps should have handrails.  Have leaves, snow, and ice cleared regularly.  Use sand or salt on walkways during winter months.  In the garage, clean up grease or oil spills. BATHROOM  Install night lights.  Install grab bars by the toilet and in the tub and shower.  Use non-skid mats or decals in the tub or shower.  Place a plastic non-slip stool in the shower to sit on, if needed.  Keep floors dry and clean up all water on the floor immediately.  Remove soap buildup in the tub or shower on a regular basis.  Secure bath mats with non-slip, double-sided rug tape.  Remove throw rugs and tripping hazards from the  floors. BEDROOMS  Install night lights.  Make sure a bedside light is easy to reach.  Do not use oversized bedding.  Keep a telephone by your bedside.  Have a firm chair with side arms to use for getting dressed.  Remove throw rugs and tripping hazards from the floor. KITCHEN  Keep handles on pots and pans turned toward the center of the stove. Use back burners when possible.  Clean up spills quickly and allow time  for drying.  Avoid walking on wet floors.  Avoid hot utensils and knives.  Position shelves so they are not too high or low.  Place commonly used objects within easy reach.  If necessary, use a sturdy step stool with a grab bar when reaching.  Keep electrical cables out of the way.  Do not use floor polish or wax that makes floors slippery. If you must use wax, use non-skid floor wax.  Remove throw rugs and tripping hazards from the floor. STAIRWAYS  Never leave objects on stairs.  Place handrails on both sides of stairways and use them. Fix any loose handrails. Make sure handrails on both sides of the stairways are as long as the stairs.  Check carpeting to make sure it is firmly attached along stairs. Make repairs to worn or loose carpet promptly.  Avoid placing throw rugs at the top or bottom of stairways, or properly secure the rug with carpet tape to prevent slippage. Get rid of throw rugs, if possible.  Have an electrician put in a light switch at the top and bottom of the stairs. OTHER FALL PREVENTION TIPS  Wear low-heel or rubber-soled shoes that are supportive and fit well. Wear closed toe shoes.  When using a stepladder, make sure it is fully opened and both spreaders are firmly locked. Do not climb a closed stepladder.  Add color or contrast paint or tape to grab bars and handrails in your home. Place contrasting color strips on first and last steps.  Learn and use mobility aids as needed. Install an electrical emergency response  system.  Turn on lights to avoid dark areas. Replace light bulbs that burn out immediately. Get light switches that glow.  Arrange furniture to create clear pathways. Keep furniture in the same place.  Firmly attach carpet with non-skid or double-sided tape.  Eliminate uneven floor surfaces.  Select a carpet pattern that does not visually hide the edge of steps.  Be aware of all pets. OTHER HOME SAFETY TIPS  Set the water temperature for 120 F (48.8 C).  Keep emergency numbers on or near the telephone.  Keep smoke detectors on every level of the home and near sleeping areas. Document Released: 09/14/2002 Document Revised: 03/25/2012 Document Reviewed: 12/14/2011 [facility] Patient Information 2015 [place], [place]. This information is not intended to replace advice given to you by your health care provider. Make sure you discuss any questions you have with your health care provider.

## 2015-07-12 ENCOUNTER — Other Ambulatory Visit: Payer: Self-pay | Admitting: Family Medicine

## 2015-07-12 DIAGNOSIS — R8290 Unspecified abnormal findings in urine: Secondary | ICD-10-CM

## 2015-07-13 ENCOUNTER — Other Ambulatory Visit: Payer: Self-pay | Admitting: Internal Medicine

## 2015-07-13 NOTE — Telephone Encounter (Signed)
Sent to the pharmacy by e-scribe. 

## 2015-07-14 DIAGNOSIS — L989 Disorder of the skin and subcutaneous tissue, unspecified: Secondary | ICD-10-CM | POA: Insufficient documentation

## 2015-07-19 ENCOUNTER — Other Ambulatory Visit (INDEPENDENT_AMBULATORY_CARE_PROVIDER_SITE_OTHER): Payer: Commercial Managed Care - HMO

## 2015-07-19 DIAGNOSIS — R8290 Unspecified abnormal findings in urine: Secondary | ICD-10-CM | POA: Diagnosis not present

## 2015-07-19 LAB — URINALYSIS, ROUTINE W REFLEX MICROSCOPIC
BILIRUBIN URINE: NEGATIVE
Ketones, ur: NEGATIVE
Leukocytes, UA: NEGATIVE
Nitrite: NEGATIVE
PH: 6 (ref 5.0–8.0)
SPECIFIC GRAVITY, URINE: 1.025 (ref 1.000–1.030)
Total Protein, Urine: NEGATIVE
UROBILINOGEN UA: 0.2 (ref 0.0–1.0)
Urine Glucose: NEGATIVE
WBC, UA: NONE SEEN (ref 0–?)

## 2015-07-20 ENCOUNTER — Other Ambulatory Visit: Payer: Self-pay | Admitting: Family Medicine

## 2015-07-20 DIAGNOSIS — R3129 Other microscopic hematuria: Secondary | ICD-10-CM

## 2015-07-27 ENCOUNTER — Ambulatory Visit (INDEPENDENT_AMBULATORY_CARE_PROVIDER_SITE_OTHER): Payer: Commercial Managed Care - HMO | Admitting: Internal Medicine

## 2015-07-27 ENCOUNTER — Encounter: Payer: Self-pay | Admitting: Internal Medicine

## 2015-07-27 VITALS — BP 160/72 | Temp 98.1°F | Ht 67.5 in | Wt 140.1 lb

## 2015-07-27 DIAGNOSIS — N5089 Other specified disorders of the male genital organs: Secondary | ICD-10-CM | POA: Diagnosis not present

## 2015-07-27 DIAGNOSIS — R3129 Other microscopic hematuria: Secondary | ICD-10-CM | POA: Diagnosis not present

## 2015-07-27 DIAGNOSIS — N4889 Other specified disorders of penis: Secondary | ICD-10-CM

## 2015-07-27 DIAGNOSIS — R600 Localized edema: Secondary | ICD-10-CM | POA: Diagnosis not present

## 2015-07-27 LAB — POCT URINALYSIS DIP (MANUAL ENTRY)
BILIRUBIN UA: NEGATIVE
BILIRUBIN UA: NEGATIVE
Glucose, UA: NEGATIVE
Leukocytes, UA: NEGATIVE
Nitrite, UA: NEGATIVE
PH UA: 5.5
PROTEIN UA: NEGATIVE
RBC UA: NEGATIVE
SPEC GRAV UA: 1.025
Urobilinogen, UA: 0.2

## 2015-07-27 MED ORDER — FUROSEMIDE 20 MG PO TABS: 20.0000 mg | ORAL_TABLET | Freq: Every day | ORAL | Status: DC

## 2015-07-27 NOTE — Patient Instructions (Signed)
Cant tell the cause of the swelling    Will try to get you to urology sooner.   Plan ultrasound of right leg and    Ct scan of abdomen pelvis   To look at pelvic organs and show kidneys also  If getting worse in the interim contact us  Can try 3-5 days or diuretic  To see if  Swelling goes down  . But ,may not work and doesn't tell us cause of swelling.
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
## 2015-07-29 ENCOUNTER — Telehealth: Payer: Self-pay | Admitting: Internal Medicine

## 2015-07-29 ENCOUNTER — Ambulatory Visit (HOSPITAL_COMMUNITY)
Admission: RE | Admit: 2015-07-29 | Discharge: 2015-07-29 | Disposition: A | Discharge status: Home / Self Care | Payer: Commercial Managed Care - HMO | Source: Ambulatory Visit | Attending: Internal Medicine | Admitting: Internal Medicine

## 2015-07-29 ENCOUNTER — Ambulatory Visit
Admission: RE | Admit: 2015-07-29 | Discharge: 2015-07-29 | Disposition: A | Discharge status: Home / Self Care | Payer: Commercial Managed Care - HMO | Source: Ambulatory Visit | Attending: Internal Medicine | Admitting: Internal Medicine

## 2015-07-29 DIAGNOSIS — Z87891 Personal history of nicotine dependence: Secondary | ICD-10-CM | POA: Insufficient documentation

## 2015-07-29 DIAGNOSIS — R600 Localized edema: Secondary | ICD-10-CM | POA: Insufficient documentation

## 2015-07-29 DIAGNOSIS — N4889 Other specified disorders of penis: Secondary | ICD-10-CM | POA: Diagnosis not present

## 2015-07-29 DIAGNOSIS — R3129 Other microscopic hematuria: Secondary | ICD-10-CM | POA: Insufficient documentation

## 2015-07-29 DIAGNOSIS — N5089 Other specified disorders of the male genital organs: Secondary | ICD-10-CM

## 2015-07-29 DIAGNOSIS — R2242 Localized swelling, mass and lump, left lower limb: Secondary | ICD-10-CM | POA: Insufficient documentation

## 2015-07-29 NOTE — Telephone Encounter (Signed)
Contact patient and ask what jhappened. Does he still have  Genital swelling?   See if can get in urology earlier . Will he try again for the scan?

## 2015-07-29 NOTE — Telephone Encounter (Signed)
Per Steva Ready CT pt was scheduled to have his CT ABDOMEN PELVIS W WO CONTRAST on 07/29/2015@2 :00 pm when pt got there he was suppose to drink water he did not want to drink  he was very hesitate about having the procedure done he left the office without having procedure he said he was feeling sick . If you need to talk to stacy please call her [phone]

## 2015-08-01 ENCOUNTER — Telehealth: Payer: Self-pay | Admitting: Internal Medicine

## 2015-08-01 NOTE — Telephone Encounter (Signed)
Daughter call and said they were going to call and reschedule pt CT scan that he refuse to do on 07/29/15

## 2015-08-02 NOTE — Telephone Encounter (Signed)
Spoke to [employer] on 07/29/15.  Pamala Hurry reports continued swelling.  She stated she is going to try and get the pt to try the CT scan again.  Pt left due to upset stomach and diarrhea.  Have monitored and pt now has an appt for CT on 10/26.  Will continue to monitor to make sure he goes and wait on results.

## 2015-08-03 ENCOUNTER — Ambulatory Visit (INDEPENDENT_AMBULATORY_CARE_PROVIDER_SITE_OTHER)
Admission: RE | Admit: 2015-08-03 | Discharge: 2015-08-03 | Disposition: A | Discharge status: Home / Self Care | Payer: Commercial Managed Care - HMO | Source: Ambulatory Visit | Attending: Internal Medicine | Admitting: Internal Medicine

## 2015-08-03 DIAGNOSIS — N5089 Other specified disorders of the male genital organs: Secondary | ICD-10-CM | POA: Diagnosis not present

## 2015-08-03 DIAGNOSIS — R600 Localized edema: Secondary | ICD-10-CM

## 2015-08-03 DIAGNOSIS — R3129 Other microscopic hematuria: Secondary | ICD-10-CM

## 2015-08-03 DIAGNOSIS — N4889 Other specified disorders of penis: Secondary | ICD-10-CM | POA: Diagnosis not present

## 2015-08-03 MED ORDER — IOHEXOL 300 MG/ML  SOLN
100.0000 mL | Freq: Once | INTRAMUSCULAR | Status: AC | PRN
  Administered 2015-08-03: 100 mL via INTRAVENOUS

## 2015-08-03 NOTE — Telephone Encounter (Signed)
WP reviewed CT results.  Informed pt's daughter Pamala Hurry) of results.  Will see Dr. McDermot on 08/04/15

## 2015-08-04 ENCOUNTER — Other Ambulatory Visit: Payer: Commercial Managed Care - HMO

## 2015-08-05 ENCOUNTER — Telehealth: Payer: Self-pay | Admitting: Internal Medicine

## 2015-08-05 NOTE — Telephone Encounter (Signed)
Daughter Pamala Hurry is asking if Dr Regis Bill would give her a call concerning the issues pt is   having at this time. Daughter did not want to elaborate, but [facility] and has another appt on Monday w/ another physician.

## 2015-08-08 ENCOUNTER — Inpatient Hospital Stay: Admission: RE | Admit: 2015-08-08 | Payer: Commercial Managed Care - HMO | Source: Ambulatory Visit

## 2015-08-08 NOTE — Telephone Encounter (Signed)
Spoke to [name].  She wanted to tell me/WP that the pt was thinking of not going to [facility] for his appointment today (Monday 08/08/15) but ultimately ended up going.  Sergio Dennis is currently waiting on results.  Asked that she have all results from [facility] sent to [facility].  Sergio Dennis agreed.

## 2015-08-08 NOTE — Telephone Encounter (Signed)
Left a message for a return call.

## 2015-08-23 ENCOUNTER — Other Ambulatory Visit: Payer: Self-pay | Admitting: Internal Medicine

## 2015-08-24 NOTE — Telephone Encounter (Signed)
Sergio Dennis left me a message.  Pt did not take medication as directed when last prescribed.  Would like to try again to reduce the swelling in his legs.  Please advise.  Thanks!

## 2015-08-25 NOTE — Telephone Encounter (Signed)
I don't  Think it will work   Can try  But only 10 pills     What is the status seeing the urologist?

## 2015-08-26 MED ORDER — FUROSEMIDE 20 MG PO TABS: 20.0000 mg | ORAL_TABLET | Freq: Every day | ORAL | Status: DC

## 2015-08-26 NOTE — Telephone Encounter (Signed)
Spoke to [place].  Pt has decided not to go back to urology.  Is requesting another CT scan in 6+ months to see growth or spread.  Please advise.  Thanks!

## 2015-08-26 NOTE — Addendum Note (Signed)
Addended by: Miles Costain T on: 08/26/2015 10:21 AM   Modules accepted: Orders

## 2015-08-28 NOTE — Telephone Encounter (Addendum)
I would have followed  up w  urology as to what to expect  .   Not necessarily a treatment  Intervention.  Sometimes interventions are to make some one feel better and not to cure a problem . Could also possible benefit from  palliative care .     OV.Marland Kitchen 3 slots( 45 minutes  prefer end of day to have more time    In January .   With family members wife and daughter  and patient  To discuss this  Situation if not going  To seeing the specialist  .

## 2015-10-12 ENCOUNTER — Other Ambulatory Visit: Payer: Self-pay | Admitting: Internal Medicine

## 2015-10-12 NOTE — Telephone Encounter (Signed)
Sent to the pharmacy by e-scribe. 

## 2015-10-21 ENCOUNTER — Ambulatory Visit: Payer: Commercial Managed Care - HMO | Admitting: Internal Medicine

## 2015-12-26 ENCOUNTER — Telehealth: Payer: Self-pay | Admitting: Internal Medicine

## 2015-12-26 NOTE — Telephone Encounter (Signed)
Based on hx given  advise  he should be seen in either  Ed or urgent care where  X ray can be done if needed ( cannot do in office )  Consider trying med center usually less busy .  He has had swelling   For a while.? Is this different ?Sergio Dennis

## 2015-12-26 NOTE — Telephone Encounter (Signed)
Spoke to [place] (daughter) advised the pt visit the ED or urgent care.

## 2015-12-26 NOTE — Telephone Encounter (Signed)
Patient Name: Sergio Dennis  DOB: [id number]    Initial Comment Caller states father's ankles, leg, genitals are swelling, fell yesterday, scraped elbows, in a lot of pain from waist down   Nurse Assessment  Nurse: Leilani Merl, RN, Heather Date/Time (Eastern Time): 12/26/2015 10:20:28 AM  Confirm and document reason for call. If symptomatic, describe symptoms. You must click the next button to save text entered. ---Caller states father's ankles, legs, genitals are swelling, fell yesterday in the bathroom, scraped elbows/forearms due to catching himself on the vanity, in a lot of pain from waist down. He hit his head above the right eye on the vanity  Has the patient traveled out of the country within the last 30 days? ---Not Applicable  Does the patient have any new or worsening symptoms? ---Yes  Will a triage be completed? ---Yes  Related visit to physician within the last 2 weeks? ---No  Does the PT have any chronic conditions? (i.e. diabetes, asthma, etc.) ---Yes  List chronic conditions. ---See MR  Is this a behavioral health or substance abuse call? ---No     Guidelines    Guideline Title Affirmed Question Affirmed Notes  Leg Swelling and Edema [1] Difficulty breathing with exertion (e.g., walking) AND [2] new onset or worsening   Head Injury Scalp swelling, bruise or pain (all triage questions negative)    Final Disposition User   Go to ED Now (or PCP triage) Leilani Merl, RN, Nira Conn    Comments  Patient would like to see Dr. Regis Bill or another doctor at the office, he does not want to go to the ED.   Referrals  GO TO FACILITY REFUSED   Disagree/Comply: Comply    Disagree/Comply: Comply

## 2016-01-26 ENCOUNTER — Emergency Department (HOSPITAL_COMMUNITY): Payer: Commercial Managed Care - HMO

## 2016-01-26 ENCOUNTER — Emergency Department (HOSPITAL_COMMUNITY)
Admission: EM | Admit: 2016-01-26 | Discharge: 2016-01-27 | Disposition: A | Discharge status: Home / Self Care | Payer: Commercial Managed Care - HMO | Attending: Emergency Medicine | Admitting: Emergency Medicine

## 2016-01-26 ENCOUNTER — Telehealth: Payer: Self-pay | Admitting: Internal Medicine

## 2016-01-26 DIAGNOSIS — N400 Enlarged prostate without lower urinary tract symptoms: Secondary | ICD-10-CM | POA: Diagnosis not present

## 2016-01-26 DIAGNOSIS — Z87891 Personal history of nicotine dependence: Secondary | ICD-10-CM | POA: Insufficient documentation

## 2016-01-26 DIAGNOSIS — R0602 Shortness of breath: Secondary | ICD-10-CM | POA: Diagnosis present

## 2016-01-26 DIAGNOSIS — Z79899 Other long term (current) drug therapy: Secondary | ICD-10-CM | POA: Insufficient documentation

## 2016-01-26 DIAGNOSIS — R609 Edema, unspecified: Secondary | ICD-10-CM

## 2016-01-26 DIAGNOSIS — Z8739 Personal history of other diseases of the musculoskeletal system and connective tissue: Secondary | ICD-10-CM | POA: Diagnosis not present

## 2016-01-26 DIAGNOSIS — R748 Abnormal levels of other serum enzymes: Secondary | ICD-10-CM | POA: Insufficient documentation

## 2016-01-26 DIAGNOSIS — R600 Localized edema: Secondary | ICD-10-CM | POA: Insufficient documentation

## 2016-01-26 DIAGNOSIS — J90 Pleural effusion, not elsewhere classified: Secondary | ICD-10-CM

## 2016-01-26 DIAGNOSIS — Z87448 Personal history of other diseases of urinary system: Secondary | ICD-10-CM | POA: Insufficient documentation

## 2016-01-26 DIAGNOSIS — R7989 Other specified abnormal findings of blood chemistry: Secondary | ICD-10-CM

## 2016-01-26 LAB — URINALYSIS, ROUTINE W REFLEX MICROSCOPIC
Bilirubin Urine: NEGATIVE
Glucose, UA: NEGATIVE mg/dL
Hgb urine dipstick: NEGATIVE
Ketones, ur: NEGATIVE mg/dL
Nitrite: NEGATIVE
Protein, ur: NEGATIVE mg/dL
Specific Gravity, Urine: 1.01 (ref 1.005–1.030)
pH: 6 (ref 5.0–8.0)

## 2016-01-26 LAB — CBC WITH DIFFERENTIAL/PLATELET
Basophils Absolute: 0 10*3/uL (ref 0.0–0.1)
Basophils Relative: 0 %
Eosinophils Absolute: 0.1 10*3/uL (ref 0.0–0.7)
Eosinophils Relative: 2 %
HCT: 31.5 % — ABNORMAL LOW (ref 39.0–52.0)
Hemoglobin: 9.9 g/dL — ABNORMAL LOW (ref 13.0–17.0)
Lymphocytes Relative: 22 %
Lymphs Abs: 1.3 10*3/uL (ref 0.7–4.0)
MCH: 28.7 pg (ref 26.0–34.0)
MCHC: 31.4 g/dL (ref 30.0–36.0)
MCV: 91.3 fL (ref 78.0–100.0)
Monocytes Absolute: 0.4 10*3/uL (ref 0.1–1.0)
Monocytes Relative: 7 %
Neutro Abs: 4.1 10*3/uL (ref 1.7–7.7)
Neutrophils Relative %: 69 %
Platelets: 190 10*3/uL (ref 150–400)
RBC: 3.45 MIL/uL — ABNORMAL LOW (ref 4.22–5.81)
RDW: 15.3 % (ref 11.5–15.5)
WBC: 5.9 10*3/uL (ref 4.0–10.5)

## 2016-01-26 LAB — COMPREHENSIVE METABOLIC PANEL
ALT: 20 U/L (ref 17–63)
AST: 32 U/L (ref 15–41)
Albumin: 3.5 g/dL (ref 3.5–5.0)
Alkaline Phosphatase: 59 U/L (ref 38–126)
Anion gap: 8 (ref 5–15)
BUN: 27 mg/dL — ABNORMAL HIGH (ref 6–20)
CO2: 27 mmol/L (ref 22–32)
Calcium: 9.1 mg/dL (ref 8.9–10.3)
Chloride: 108 mmol/L (ref 101–111)
Creatinine, Ser: 1.38 mg/dL — ABNORMAL HIGH (ref 0.61–1.24)
GFR calc Af Amer: 51 mL/min — ABNORMAL LOW (ref >60)
GFR calc non Af Amer: 44 mL/min — ABNORMAL LOW (ref >60)
Glucose, Bld: 115 mg/dL — ABNORMAL HIGH (ref 65–99)
Potassium: 3.2 mmol/L — ABNORMAL LOW (ref 3.5–5.1)
Sodium: 143 mmol/L (ref 135–145)
Total Bilirubin: 0.6 mg/dL (ref 0.3–1.2)
Total Protein: 7.6 g/dL (ref 6.5–8.1)

## 2016-01-26 LAB — URINE MICROSCOPIC-ADD ON

## 2016-01-26 LAB — I-STAT TROPONIN, ED: Troponin i, poc: 0.01 ng/mL (ref 0.00–0.08)

## 2016-01-26 LAB — POC OCCULT BLOOD, ED: FECAL OCCULT BLD: NEGATIVE

## 2016-01-26 LAB — BRAIN NATRIURETIC PEPTIDE: B Natriuretic Peptide: 906.4 pg/mL — ABNORMAL HIGH (ref 0.0–100.0)

## 2016-01-26 MED ORDER — IOPAMIDOL (ISOVUE-300) INJECTION 61%
100.0000 mL | Freq: Once | INTRAVENOUS | Status: AC | PRN
Start: 2016-01-26 — End: 2016-01-26
  Administered 2016-01-26: 80 mL via INTRAVENOUS

## 2016-01-26 MED ORDER — DIATRIZOATE MEGLUMINE & SODIUM 66-10 % PO SOLN
15.0000 mL | Freq: Once | ORAL | Status: AC
  Administered 2016-01-26: 15 mL via ORAL

## 2016-01-26 NOTE — ED Notes (Signed)
Patient has had bilateral lower extremity edema that extends into the abdomen . Family states he has had the edema x 5-6 weeks and is getting progressively worse. Urologist wanted to do a biopsy of the prostate due to an elevated PSA, but patient refused. Today, patient c/o SOB.

## 2016-01-26 NOTE — ED Notes (Signed)
Patient transported to X-ray 

## 2016-01-26 NOTE — ED Notes (Signed)
Pt attempting to use urinal. Will come back to assess pt.

## 2016-01-26 NOTE — Telephone Encounter (Signed)
Spoke to pt's daughter Sergio Dennis, told her I received a triage note on your father and wanted to know if they told you to do any thing. She said yes, she is on her way home from work to get her father to take him to the hospital. She said she is not sure he will go but she will try. Told her okay, we do not have any appointments available today and Dr. Regis Bill is not here. So Dr. Maudie Mercury said pt needs to be seen today at Urgent Care or ED. Sergio Dennis verbalized understanding and said pt needs ED has a lot going on. She said the swelling is going all the way up to his abdomen and is having labored breathing. Also gets SOB with exertion. Told her okay please take him to the ED for evaluation. Sergio Dennis verbalized understanding.

## 2016-01-26 NOTE — Telephone Encounter (Signed)
FYI

## 2016-01-26 NOTE — Telephone Encounter (Signed)
[facility] - Client [place] Patient Name: Sergio Dennis DOB: [id number] Initial Comment Caller states father's legs and lower abd are swollen. Legs and thighs are rock hard and painful. Father also has dementia. Wants to know if he can get pain med. Nurse Assessment Nurse: Dimas Chyle, RN, Dellis Filbert Date/Time Eilene Ghazi Time): 01/26/2016 3:47:37 PM Confirm and document reason for call. If symptomatic, describe symptoms. You must click the next button to save text entered. ---Caller states father's legs and lower abdomen are swollen. Legs and thighs are rock hard and painful. Father also has dementia. Wants to know if he can get pain med. Has had symptoms for a year. Has the patient traveled out of the country within the last 30 days? ---No Does the patient have any new or worsening symptoms? ---Yes Will a triage be completed? ---Yes Related visit to physician within the last 2 weeks? ---Yes Does the PT have any chronic conditions? (i.e. diabetes, asthma, etc.) ---Yes List chronic conditions. ---Possible bladder cancer Is this a behavioral health or substance abuse call? ---No Guidelines Guideline Title Affirmed Question Affirmed Notes Leg Swelling and Edema SEVERE leg swelling (e.g., swelling extends above knee, entire leg is swollen, weeping fluid) Final Disposition User See Physician within 4 Hours (or PCP triage) Dimas Chyle, RN, Dellis Filbert

## 2016-01-27 MED ORDER — FUROSEMIDE 20 MG PO TABS: 20.0000 mg | ORAL_TABLET | Freq: Every day | ORAL | Status: DC

## 2016-01-27 MED ORDER — FUROSEMIDE 40 MG PO TABS: 40.0000 mg | ORAL_TABLET | Freq: Two times a day (BID) | ORAL | Status: DC

## 2016-01-27 NOTE — Discharge Instructions (Signed)
We advised you to be admitted to the hospital today, however because you declined, we will have you follow-up outpatient for further evaluation of your new onset congestive heart failure, bladder outlet obstruction, new bladder mass, and acute kidney injury.  Medications: Lasix  Treatment: Please take Lasix as prescribed for your leg swelling and fluid on your lungs causing you to have shortness of breath.   Follow-up: Please follow-up with [facility] for further evaluation and treatment of your new onset congestive heart failure. Please return to your urologist for further evaluation of your bladder mass. Please follow-up with your primary care provider for further evaluation and treatment of your elevated BUN and creatinine (acute kidney injury). It is possible because of your distended bladder that it could begin to stop working and you would not be able to pass urine. In this case, please return to emergency department as soon as possible. Please return to the emergency department if you develop any new or worsening symptoms.   Heart Failure Heart failure is a condition in which the heart has trouble pumping blood. This means your heart does not pump blood efficiently for your body to work well. In some cases of heart failure, fluid may back up into your lungs or you may have swelling (edema) in your lower legs. Heart failure is usually a long-term (chronic) condition. It is important for you to take good care of yourself and follow your health care provider's treatment plan. CAUSES  Some health conditions can cause heart failure. Those health conditions include:  High blood pressure (hypertension). Hypertension causes the heart muscle to work harder than normal. When pressure in the blood vessels is high, the heart needs to pump (contract) with more force in order to circulate blood throughout the body. High blood pressure eventually causes the heart to become stiff and  weak.  Coronary artery disease (CAD). CAD is the buildup of cholesterol and fat (plaque) in the arteries of the heart. The blockage in the arteries deprives the heart muscle of oxygen and blood. This can cause chest pain and may lead to a heart attack. High blood pressure can also contribute to CAD.  Heart attack (myocardial infarction). A heart attack occurs when one or more arteries in the heart become blocked. The loss of oxygen damages the muscle tissue of the heart. When this happens, part of the heart muscle dies. The injured tissue does not contract as well and weakens the heart's ability to pump blood.  Abnormal heart valves. When the heart valves do not open and close properly, it can cause heart failure. This makes the heart muscle pump harder to keep the blood flowing.  Heart muscle disease (cardiomyopathy or myocarditis). Heart muscle disease is damage to the heart muscle from a variety of causes. These can include drug or alcohol abuse, infections, or unknown reasons. These can increase the risk of heart failure.  Lung disease. Lung disease makes the heart work harder because the lungs do not work properly. This can cause a strain on the heart, leading it to fail.  Diabetes. Diabetes increases the risk of heart failure. High blood sugar contributes to high fat (lipid) levels in the blood. Diabetes can also cause slow damage to tiny blood vessels that carry important nutrients to the heart muscle. When the heart does not get enough oxygen and food, it can cause the heart to become weak and stiff. This leads to a heart that does not contract efficiently.  Other  conditions can contribute to heart failure. These include abnormal heart rhythms, thyroid problems, and low blood counts (anemia). Certain unhealthy behaviors can increase the risk of heart failure, including:  Being overweight.  Smoking or chewing tobacco.  Eating foods high in fat and cholesterol.  Abusing illicit drugs or  alcohol.  Lacking physical activity. SYMPTOMS  Heart failure symptoms may vary and can be hard to detect. Symptoms may include:  Shortness of breath with activity, such as climbing stairs.  Persistent cough.  Swelling of the feet, ankles, legs, or abdomen.  Unexplained weight gain.  Difficulty breathing when lying flat (orthopnea).  Waking from sleep because of the need to sit up and get more air.  Rapid heartbeat.  Fatigue and loss of energy.  Feeling light-headed, dizzy, or close to fainting.  Loss of appetite.  Nausea.  Increased urination during the night (nocturia). DIAGNOSIS  A diagnosis of heart failure is based on your history, symptoms, physical examination, and diagnostic tests. Diagnostic tests for heart failure may include:  Echocardiography.  Electrocardiography.  Chest X-ray.  Blood tests.  Exercise stress test.  Cardiac angiography.  Radionuclide scans. TREATMENT  Treatment is aimed at managing the symptoms of heart failure. Medicines, behavioral changes, or surgical intervention may be necessary to treat heart failure.  Medicines to help treat heart failure may include:  Angiotensin-converting enzyme (ACE) inhibitors. This type of medicine blocks the effects of a blood protein called angiotensin-converting enzyme. ACE inhibitors relax (dilate) the blood vessels and help lower blood pressure.  Angiotensin receptor blockers (ARBs). This type of medicine blocks the actions of a blood protein called angiotensin. Angiotensin receptor blockers dilate the blood vessels and help lower blood pressure.  Water pills (diuretics). Diuretics cause the kidneys to remove salt and water from the blood. The extra fluid is removed through urination. This loss of extra fluid lowers the volume of blood the heart pumps.  Beta blockers. These prevent the heart from beating too fast and improve heart muscle strength.  Digitalis. This increases the force of the  heartbeat.  Healthy behavior changes include:  Obtaining and maintaining a healthy weight.  Stopping smoking or chewing tobacco.  Eating heart-healthy foods.  Limiting or avoiding alcohol.  Stopping illicit drug use.  Physical activity as directed by your health care provider.  Surgical treatment for heart failure may include:  A procedure to open blocked arteries, repair damaged heart valves, or remove damaged heart muscle tissue.  A pacemaker to improve heart muscle function and control certain abnormal heart rhythms.  An internal cardioverter defibrillator to treat certain serious abnormal heart rhythms.  A left ventricular assist device (LVAD) to assist the pumping ability of the heart. HOME CARE INSTRUCTIONS   Take medicines only as directed by your health care provider. Medicines are important in reducing the workload of your heart, slowing the progression of heart failure, and improving your symptoms.  Do not stop taking your medicine unless directed by your health care provider.  Do not skip any dose of medicine.  Refill your prescriptions before you run out of medicine. Your medicines are needed every day.  Engage in moderate physical activity if directed by your health care provider. Moderate physical activity can benefit some people. The elderly and people with severe heart failure should consult with a health care provider for physical activity recommendations.  Eat heart-healthy foods. Food choices should be free of trans fat and low in saturated fat, cholesterol, and salt (sodium). Healthy choices include fresh or frozen fruits  and vegetables, fish, lean meats, legumes, fat-free or low-fat dairy products, and whole grain or high fiber foods. Talk to a dietitian to learn more about heart-healthy foods.  Limit sodium if directed by your health care provider. Sodium restriction may reduce symptoms of heart failure in some people. Talk to a dietitian to learn more  about heart-healthy seasonings.  Use healthy cooking methods. Healthy cooking methods include roasting, grilling, broiling, baking, poaching, steaming, or stir-frying. Talk to a dietitian to learn more about healthy cooking methods.  Limit fluids if directed by your health care provider. Fluid restriction may reduce symptoms of heart failure in some people.  Weigh yourself every day. Daily weights are important in the early recognition of excess fluid. You should weigh yourself every morning after you urinate and before you eat breakfast. Wear the same amount of clothing each time you weigh yourself. Record your daily weight. Provide your health care provider with your weight record.  Monitor and record your blood pressure if directed by your health care provider.  Check your pulse if directed by your health care provider.  Lose weight if directed by your health care provider. Weight loss may reduce symptoms of heart failure in some people.  Stop smoking or chewing tobacco. Nicotine makes your heart work harder by causing your blood vessels to constrict. Do not use nicotine gum or patches before talking to your health care provider.  Keep all follow-up visits as directed by your health care provider. This is important.  Limit alcohol intake to no more than 1 drink per day for nonpregnant women and 2 drinks per day for men. One drink equals 12 ounces of beer, 5 ounces of wine, or 1 ounces of hard liquor. Drinking more than that is harmful to your heart. Tell your health care provider if you drink alcohol several times a week. Talk with your health care provider about whether alcohol is safe for you. If your heart has already been damaged by alcohol or you have severe heart failure, drinking alcohol should be stopped completely.  Stop illicit drug use.  Stay up-to-date with immunizations. It is especially important to prevent respiratory infections through current pneumococcal and influenza  immunizations.  Manage other health conditions such as hypertension, diabetes, thyroid disease, or abnormal heart rhythms as directed by your health care provider.  Learn to manage stress.  Plan rest periods when fatigued.  Learn strategies to manage high temperatures. If the weather is extremely hot:  Avoid vigorous physical activity.  Use air conditioning or fans or seek a cooler location.  Avoid caffeine and alcohol.  Wear loose-fitting, lightweight, and light-colored clothing.  Learn strategies to manage cold temperatures. If the weather is extremely cold:  Avoid vigorous physical activity.  Layer clothes.  Wear mittens or gloves, a hat, and a scarf when going outside.  Avoid alcohol.  Obtain ongoing education and support as needed.  Participate in or seek rehabilitation as needed to maintain or improve independence and quality of life. SEEK MEDICAL CARE IF:   You have a rapid weight gain.  You have increasing shortness of breath that is unusual for you.  You are unable to participate in your usual physical activities.  You tire easily.  You cough more than normal, especially with physical activity.  You have any or more swelling in areas such as your hands, feet, ankles, or abdomen.  You are unable to sleep because it is hard to breathe.  You feel like your heart is beating fast (  palpitations).  You become dizzy or light-headed upon standing up. SEEK IMMEDIATE MEDICAL CARE IF:   You have difficulty breathing.  There is a change in mental status such as decreased alertness or difficulty with concentration.  You have a pain or discomfort in your chest.  You have an episode of fainting (syncope). MAKE SURE YOU:   Understand these instructions.  Will watch your condition.  Will get help right away if you are not doing well or get worse.   This information is not intended to replace advice given to you by your health care provider. Make sure you  discuss any questions you have with your health care provider.   Document Released: 09/24/2005 Document Revised: 02/08/2015 Document Reviewed: 10/24/2012 Elsevier Interactive Patient Education 2016 [employer]  Edema Edema is an abnormal buildup of fluids in your bodytissues. Edema is somewhatdependent on gravity to pull the fluid to the lowest place in your body. That makes the condition more common in the legs and thighs (lower extremities). Painless swelling of the feet and ankles is common and becomes more likely as you get older. It is also common in looser tissues, like around your eyes.  When the affected area is squeezed, the fluid may move out of that spot and leave a dent for a few moments. This dent is called pitting.  CAUSES  There are many possible causes of edema. Eating too much salt and being on your feet or sitting for a long time can cause edema in your legs and ankles. Hot weather may make edema worse. Common medical causes of edema include:  Heart failure.  Liver disease.  Kidney disease.  Weak blood vessels in your legs.  Cancer.  An injury.  Pregnancy.  Some medications.  Obesity. SYMPTOMS  Edema is usually painless.Your skin may look swollen or shiny.  DIAGNOSIS  Your health care provider may be able to diagnose edema by asking about your medical history and doing a physical exam. You may need to have tests such as X-rays, an electrocardiogram, or blood tests to check for medical conditions that may cause edema.  TREATMENT  Edema treatment depends on the cause. If you have heart, liver, or kidney disease, you need the treatment appropriate for these conditions. General treatment may include:  Elevation of the affected body part above the level of your heart.  Compression of the affected body part. Pressure from elastic bandages or support stockings squeezes the tissues and forces fluid back into the blood vessels. This keeps fluid from entering the  tissues.  Restriction of fluid and salt intake.  Use of a water pill (diuretic). These medications are appropriate only for some types of edema. They pull fluid out of your body and make you urinate more often. This gets rid of fluid and reduces swelling, but diuretics can have side effects. Only use diuretics as directed by your health care provider. HOME CARE INSTRUCTIONS   Keep the affected body part above the level of your heart when you are lying down.   Do not sit still or stand for prolonged periods.   Do not put anything directly under your knees when lying down.  Do not wear constricting clothing or garters on your upper legs.   Exercise your legs to work the fluid back into your blood vessels. This may help the swelling go down.   Wear elastic bandages or support stockings to reduce ankle swelling as directed by your health care provider.   Eat a low-salt  diet to reduce fluid if your health care provider recommends it.   Only take medicines as directed by your health care provider. SEEK MEDICAL CARE IF:   Your edema is not responding to treatment.  You have heart, liver, or kidney disease and notice symptoms of edema.  You have edema in your legs that does not improve after elevating them.   You have sudden and unexplained weight gain. SEEK IMMEDIATE MEDICAL CARE IF:   You develop shortness of breath or chest pain.   You cannot breathe when you lie down.  You develop pain, redness, or warmth in the swollen areas.   You have heart, liver, or kidney disease and suddenly get edema.  You have a fever and your symptoms suddenly get worse. MAKE SURE YOU:   Understand these instructions.  Will watch your condition.  Will get help right away if you are not doing well or get worse.   This information is not intended to replace advice given to you by your health care provider. Make sure you discuss any questions you have with your health care provider.     Document Released: 09/24/2005 Document Revised: 10/15/2014 Document Reviewed: 07/17/2013 Elsevier Interactive Patient Education [employer].
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
## 2016-01-30 ENCOUNTER — Ambulatory Visit (INDEPENDENT_AMBULATORY_CARE_PROVIDER_SITE_OTHER): Payer: Commercial Managed Care - HMO | Admitting: Internal Medicine

## 2016-01-30 ENCOUNTER — Encounter: Payer: Self-pay | Admitting: Internal Medicine

## 2016-01-30 VITALS — BP 168/80 | Temp 98.4°F | Ht 67.5 in | Wt 151.0 lb

## 2016-01-30 DIAGNOSIS — E876 Hypokalemia: Secondary | ICD-10-CM | POA: Diagnosis not present

## 2016-01-30 DIAGNOSIS — R600 Localized edema: Secondary | ICD-10-CM

## 2016-01-30 DIAGNOSIS — N1330 Unspecified hydronephrosis: Secondary | ICD-10-CM

## 2016-01-30 DIAGNOSIS — I509 Heart failure, unspecified: Secondary | ICD-10-CM | POA: Diagnosis not present

## 2016-01-30 DIAGNOSIS — D649 Anemia, unspecified: Secondary | ICD-10-CM

## 2016-01-30 DIAGNOSIS — J90 Pleural effusion, not elsewhere classified: Secondary | ICD-10-CM

## 2016-01-30 LAB — CBC WITH DIFFERENTIAL/PLATELET
BASOS ABS: 0 10*3/uL (ref 0.0–0.1)
Basophils Relative: 0.6 % (ref 0.0–3.0)
EOS PCT: 1.3 % (ref 0.0–5.0)
Eosinophils Absolute: 0.1 10*3/uL (ref 0.0–0.7)
HCT: 31.8 % — ABNORMAL LOW (ref 39.0–52.0)
HEMOGLOBIN: 10.5 g/dL — AB (ref 13.0–17.0)
LYMPHS ABS: 1.4 10*3/uL (ref 0.7–4.0)
Lymphocytes Relative: 21.9 % (ref 12.0–46.0)
MCHC: 33 g/dL (ref 30.0–36.0)
MCV: 89.8 fl (ref 78.0–100.0)
MONOS PCT: 9.9 % (ref 3.0–12.0)
Monocytes Absolute: 0.6 10*3/uL (ref 0.1–1.0)
NEUTROS PCT: 66.3 % (ref 43.0–77.0)
Neutro Abs: 4.2 10*3/uL (ref 1.4–7.7)
Platelets: 200 10*3/uL (ref 150.0–400.0)
RBC: 3.54 Mil/uL — AB (ref 4.22–5.81)
RDW: 16.6 % — ABNORMAL HIGH (ref 11.5–15.5)
WBC: 6.3 10*3/uL (ref 4.0–10.5)

## 2016-01-30 LAB — BASIC METABOLIC PANEL
BUN: 29 mg/dL — ABNORMAL HIGH (ref 6–23)
CHLORIDE: 99 meq/L (ref 96–112)
CO2: 31 mEq/L (ref 19–32)
CREATININE: 1.48 mg/dL (ref 0.40–1.50)
Calcium: 9.3 mg/dL (ref 8.4–10.5)
GFR: 47.57 mL/min — AB (ref >60.00)
Glucose, Bld: 91 mg/dL (ref 70–99)
Potassium: 2.9 mEq/L — ABNORMAL LOW (ref 3.5–5.1)
Sodium: 142 mEq/L (ref 135–145)

## 2016-01-30 LAB — IBC PANEL
IRON: 35 ug/dL — AB (ref 42–165)
SATURATION RATIOS: 10.8 % — AB (ref 20.0–50.0)
Transferrin: 232 mg/dL (ref 212.0–360.0)

## 2016-01-30 LAB — MAGNESIUM: MAGNESIUM: 1.6 mg/dL (ref 1.5–2.5)

## 2016-01-30 MED ORDER — POTASSIUM CHLORIDE CRYS ER 20 MEQ PO TBCR: 20.0000 meq | EXTENDED_RELEASE_TABLET | Freq: Two times a day (BID) | ORAL | Status: DC

## 2016-01-30 NOTE — Patient Instructions (Addendum)
Need to   Check potassium level  Because was low    In ed .  You had fluids in your lungs  Form mild heart failure . And the fluid  May improve  With the pills taken .  We may need to add potassium  Pills . The ct scan shows   Possible blockage  Of the ureters kidneys  That backs up the  Urine in the  and can do damage .,  I still would   See  Urology  About the possible blockage    To see if anything can relieve the blockage .   Or help any of your symptoms.   Plan follow up depending on results .  Let us know  If wants to see cardiology about the heart failure  Strain on the heart.   We can set this up.  For  now we can  Use the fluid pill to help.  ROV in 3-4 weeks .    IMPRESSION: Mild cardiac enlargement with bilateral pleural effusions and interstitial edema. Atelectasis at the lung bases. Central bronchiectasis. Pulmonary emphysema.  Diffuse edema throughout the subcutaneous fat of the abdomen and pelvis. Prominent retroperitoneal and pelvic lymphadenopathy, worrisome for metastasis. Bladder distention suggesting bladder outlet obstruction. Enlarged prostate gland. Asymmetric wall thickening in the posterior aspect of the bladder suggesting bladder neoplasm. Bilateral hydronephrosis and hydroureter may indicate reflux due to eyelid obstruction or extrinsic compression of the ureters.  Possible destructive bone lesion in the right humeral head.
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
## 2016-01-31 ENCOUNTER — Other Ambulatory Visit: Payer: Self-pay | Admitting: Family Medicine

## 2016-02-02 ENCOUNTER — Other Ambulatory Visit (INDEPENDENT_AMBULATORY_CARE_PROVIDER_SITE_OTHER): Payer: Commercial Managed Care - HMO

## 2016-02-02 DIAGNOSIS — I10 Essential (primary) hypertension: Secondary | ICD-10-CM | POA: Diagnosis not present

## 2016-02-02 LAB — BASIC METABOLIC PANEL
BUN: 37 mg/dL — AB (ref 6–23)
CALCIUM: 8.8 mg/dL (ref 8.4–10.5)
CO2: 34 mEq/L — ABNORMAL HIGH (ref 19–32)
Chloride: 95 mEq/L — ABNORMAL LOW (ref 96–112)
Creatinine, Ser: 1.65 mg/dL — ABNORMAL HIGH (ref 0.40–1.50)
GFR: 41.96 mL/min — AB (ref >60.00)
GLUCOSE: 169 mg/dL — AB (ref 70–99)
Potassium: 3.2 mEq/L — ABNORMAL LOW (ref 3.5–5.1)
SODIUM: 140 meq/L (ref 135–145)

## 2016-02-03 ENCOUNTER — Other Ambulatory Visit: Payer: Self-pay | Admitting: Internal Medicine

## 2016-02-03 DIAGNOSIS — E876 Hypokalemia: Secondary | ICD-10-CM

## 2016-02-06 ENCOUNTER — Other Ambulatory Visit: Payer: Commercial Managed Care - HMO

## 2016-02-10 ENCOUNTER — Other Ambulatory Visit: Payer: Self-pay | Admitting: Internal Medicine

## 2016-02-10 MED ORDER — FUROSEMIDE 40 MG PO TABS: 40.0000 mg | ORAL_TABLET | Freq: Two times a day (BID) | ORAL | Status: DC

## 2016-02-14 ENCOUNTER — Ambulatory Visit (INDEPENDENT_AMBULATORY_CARE_PROVIDER_SITE_OTHER): Payer: Commercial Managed Care - HMO | Admitting: Cardiology

## 2016-02-14 ENCOUNTER — Encounter: Payer: Self-pay | Admitting: Cardiology

## 2016-02-14 VITALS — BP 162/88 | HR 90 | Ht 70.0 in | Wt 144.4 lb

## 2016-02-14 DIAGNOSIS — I509 Heart failure, unspecified: Secondary | ICD-10-CM | POA: Diagnosis not present

## 2016-02-14 MED ORDER — FUROSEMIDE 40 MG PO TABS
40.0000 mg | ORAL_TABLET | Freq: Two times a day (BID) | ORAL | Status: DC
Start: 2016-02-14 — End: 2016-02-22

## 2016-02-14 MED ORDER — POTASSIUM CHLORIDE CRYS ER 20 MEQ PO TBCR: 20.0000 meq | EXTENDED_RELEASE_TABLET | Freq: Two times a day (BID) | ORAL | Status: DC

## 2016-02-14 NOTE — Patient Instructions (Addendum)
Continue Lasix 40 mg twice daily and potassium 20 meq daily  Restrict your salt intake.  I will see you in 2 months.  Low-Sodium Eating Plan Sodium raises blood pressure and causes water to be held in the body. Getting less sodium from food will help lower your blood pressure, reduce any swelling, and protect your heart, liver, and kidneys. We get sodium by adding salt (sodium chloride) to food. Most of our sodium comes from canned, boxed, and frozen foods. Restaurant foods, fast foods, and pizza are also very high in sodium. Even if you take medicine to lower your blood pressure or to reduce fluid in your body, getting less sodium from your food is important. WHAT IS MY PLAN? Most people should limit their sodium intake to 2,300 mg a day. Your health care provider recommends that you limit your sodium intake to __________ a day.  WHAT DO I NEED TO KNOW ABOUT THIS EATING PLAN? For the low-sodium eating plan, you will follow these general guidelines:  Choose foods with a % Daily Value for sodium of less than 5% (as listed on the food label).   Use salt-free seasonings or herbs instead of table salt or sea salt.   Check with your health care provider or pharmacist before using salt substitutes.   Eat fresh foods.  Eat more vegetables and fruits.  Limit canned vegetables. If you do use them, rinse them well to decrease the sodium.   Limit cheese to 1 oz (28 g) per day.   Eat lower-sodium products, often labeled as "lower sodium" or "no salt added."  Avoid foods that contain monosodium glutamate (MSG). MSG is sometimes added to [place] food and some canned foods.  Check food labels (Nutrition Facts labels) on foods to learn how much sodium is in one serving.  Eat more home-cooked food and less restaurant, buffet, and fast food.  When eating at a restaurant, ask that your food be prepared with less salt, or no salt if possible.  HOW DO I READ FOOD LABELS FOR SODIUM  INFORMATION? The Nutrition Facts label lists the amount of sodium in one serving of the food. If you eat more than one serving, you must multiply the listed amount of sodium by the number of servings. Food labels may also identify foods as:  Sodium free--Less than 5 mg in a serving.  Very low sodium--35 mg or less in a serving.  Low sodium--140 mg or less in a serving.  Light in sodium--50% less sodium in a serving. For example, if a food that usually has 300 mg of sodium is changed to become light in sodium, it will have 150 mg of sodium.  Reduced sodium--25% less sodium in a serving. For example, if a food that usually has 400 mg of sodium is changed to reduced sodium, it will have 300 mg of sodium. WHAT FOODS CAN I EAT? Grains Low-sodium cereals, including oats, puffed wheat and rice, and shredded wheat cereals. Low-sodium crackers. Unsalted rice and pasta. Lower-sodium bread.  Vegetables Frozen or fresh vegetables. Low-sodium or reduced-sodium canned vegetables. Low-sodium or reduced-sodium tomato sauce and paste. Low-sodium or reduced-sodium tomato and vegetable juices.  Fruits Fresh, frozen, and canned fruit. Fruit juice.  Meat and Other Protein Products Low-sodium canned tuna and salmon. Fresh or frozen meat, poultry, seafood, and fish. Lamb. Unsalted nuts. Dried beans, peas, and lentils without added salt. Unsalted canned beans. Homemade soups without salt. Eggs.  Dairy Milk. Soy milk. Ricotta cheese. Low-sodium or reduced-sodium cheeses. Yogurt.  Condiments Fresh and dried herbs and spices. Salt-free seasonings. Onion and garlic powders. Low-sodium varieties of mustard and ketchup. Fresh or refrigerated horseradish. Lemon juice.  Fats and Oils Reduced-sodium salad dressings. Unsalted butter.  Other Unsalted popcorn and pretzels.  The items listed above may not be a complete list of recommended foods or beverages. Contact your dietitian for more options. WHAT FOODS  ARE NOT RECOMMENDED? Grains Instant hot cereals. Bread stuffing, pancake, and biscuit mixes. Croutons. Seasoned rice or pasta mixes. Noodle soup cups. Boxed or frozen macaroni and cheese. Self-rising flour. Regular salted crackers. Vegetables Regular canned vegetables. Regular canned tomato sauce and paste. Regular tomato and vegetable juices. Frozen vegetables in sauces. Salted [place] fries. Olives. Angie Fava. Relishes. Sauerkraut. Salsa. Meat and Other Protein Products Salted, canned, smoked, spiced, or pickled meats, seafood, or fish. Bacon, ham, sausage, hot dogs, corned beef, chipped beef, and packaged luncheon meats. Salt pork. Jerky. Pickled herring. Anchovies, regular canned tuna, and sardines. Salted nuts. Dairy Processed cheese and cheese spreads. Cheese curds. Blue cheese and cottage cheese. Buttermilk.  Condiments Onion and garlic salt, seasoned salt, table salt, and sea salt. Canned and packaged gravies. Worcestershire sauce. Tartar sauce. Barbecue sauce. Teriyaki sauce. Soy sauce, including reduced sodium. Steak sauce. Fish sauce. Oyster sauce. Cocktail sauce. Horseradish that you find on the shelf. Regular ketchup and mustard. Meat flavorings and tenderizers. Bouillon cubes. Hot sauce. Tabasco sauce. Marinades. Taco seasonings. Relishes. Fats and Oils Regular salad dressings. Salted butter. Margarine. Ghee. Bacon fat.  Other Potato and tortilla chips. Corn chips and puffs. Salted popcorn and pretzels. Canned or dried soups. Pizza. Frozen entrees and pot pies.  The items listed above may not be a complete list of foods and beverages to avoid. Contact your dietitian for more information.   This information is not intended to replace advice given to you by your health care provider. Make sure you discuss any questions you have with your health care provider.   Document Released: 03/16/2002 Document Revised: 10/15/2014 Document Reviewed: 07/29/2013 Elsevier Interactive Patient  Education [employer].
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
## 2016-02-15 ENCOUNTER — Other Ambulatory Visit (INDEPENDENT_AMBULATORY_CARE_PROVIDER_SITE_OTHER): Payer: Commercial Managed Care - HMO

## 2016-02-15 DIAGNOSIS — E876 Hypokalemia: Secondary | ICD-10-CM | POA: Diagnosis not present

## 2016-02-15 LAB — BASIC METABOLIC PANEL
BUN: 46 mg/dL — ABNORMAL HIGH (ref 6–23)
CALCIUM: 8.9 mg/dL (ref 8.4–10.5)
CO2: 29 mEq/L (ref 19–32)
CREATININE: 1.88 mg/dL — AB (ref 0.40–1.50)
Chloride: 99 mEq/L (ref 96–112)
GFR: 36.09 mL/min — AB (ref >60.00)
Glucose, Bld: 115 mg/dL — ABNORMAL HIGH (ref 70–99)
Potassium: 3.8 mEq/L (ref 3.5–5.1)
SODIUM: 138 meq/L (ref 135–145)

## 2016-02-20 ENCOUNTER — Other Ambulatory Visit: Payer: Self-pay | Admitting: Family Medicine

## 2016-02-22 MED ORDER — POTASSIUM CHLORIDE CRYS ER 20 MEQ PO TBCR: 20.0000 meq | EXTENDED_RELEASE_TABLET | Freq: Two times a day (BID) | ORAL | Status: AC

## 2016-02-22 MED ORDER — FUROSEMIDE 40 MG PO TABS: 40.0000 mg | ORAL_TABLET | Freq: Two times a day (BID) | ORAL | Status: AC

## 2016-02-22 MED ORDER — FUROSEMIDE 40 MG PO TABS: 40.0000 mg | ORAL_TABLET | Freq: Two times a day (BID) | ORAL | Status: DC

## 2016-02-22 NOTE — Telephone Encounter (Signed)
Can refill x 90 days

## 2016-02-22 NOTE — Telephone Encounter (Signed)
Sent to the pharmacy by e-scribe. 

## 2016-02-22 NOTE — Addendum Note (Signed)
Addended by: Miles Costain T on: 02/22/2016 09:05 AM   Modules accepted: Orders
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
## 2016-02-28 ENCOUNTER — Ambulatory Visit (INDEPENDENT_AMBULATORY_CARE_PROVIDER_SITE_OTHER): Payer: Commercial Managed Care - HMO | Admitting: Internal Medicine

## 2016-02-28 ENCOUNTER — Encounter: Payer: Self-pay | Admitting: Internal Medicine

## 2016-02-28 VITALS — BP 150/68 | HR 87 | Temp 98.2°F | Ht 70.0 in | Wt 148.0 lb

## 2016-02-28 DIAGNOSIS — I509 Heart failure, unspecified: Secondary | ICD-10-CM | POA: Diagnosis not present

## 2016-02-28 DIAGNOSIS — E876 Hypokalemia: Secondary | ICD-10-CM | POA: Diagnosis not present

## 2016-02-28 DIAGNOSIS — IMO0001 Reserved for inherently not codable concepts without codable children: Secondary | ICD-10-CM

## 2016-02-28 DIAGNOSIS — R030 Elevated blood-pressure reading, without diagnosis of hypertension: Secondary | ICD-10-CM

## 2016-02-28 DIAGNOSIS — N3944 Nocturnal enuresis: Secondary | ICD-10-CM

## 2016-02-28 DIAGNOSIS — D649 Anemia, unspecified: Secondary | ICD-10-CM

## 2016-02-28 NOTE — Patient Instructions (Addendum)
Take the second dose of lasix    At noon. To see if helps the night .  Incontinence .    bp is still up.  Repeat  Was  150 range  Decrease salt of sodium in the diet.  May help  Keeping the fluid down and not having so many problems at night   Avoid advil aleve  For now fluid retention . Can try tylenol.  For pain .  Plan ROV in 3 months  Or earlier    If needed .

## 2016-02-28 NOTE — Progress Notes (Signed)
Pre visit review using our clinic review tool, if applicable. No additional management support is needed unless otherwise documented below in the visit note. 

## 2016-03-10 ENCOUNTER — Emergency Department (HOSPITAL_COMMUNITY): Payer: Commercial Managed Care - HMO

## 2016-03-10 ENCOUNTER — Encounter (HOSPITAL_COMMUNITY): Payer: Self-pay | Admitting: Emergency Medicine

## 2016-03-10 ENCOUNTER — Emergency Department (HOSPITAL_COMMUNITY)
Admission: EM | Admit: 2016-03-10 | Discharge: 2016-03-10 | Disposition: A | Discharge status: Home / Self Care | Payer: Commercial Managed Care - HMO | Attending: Emergency Medicine | Admitting: Emergency Medicine

## 2016-03-10 DIAGNOSIS — Z96641 Presence of right artificial hip joint: Secondary | ICD-10-CM | POA: Insufficient documentation

## 2016-03-10 DIAGNOSIS — R1031 Right lower quadrant pain: Secondary | ICD-10-CM | POA: Insufficient documentation

## 2016-03-10 DIAGNOSIS — Z79899 Other long term (current) drug therapy: Secondary | ICD-10-CM | POA: Insufficient documentation

## 2016-03-10 DIAGNOSIS — I509 Heart failure, unspecified: Secondary | ICD-10-CM | POA: Insufficient documentation

## 2016-03-10 DIAGNOSIS — Z87891 Personal history of nicotine dependence: Secondary | ICD-10-CM | POA: Diagnosis not present

## 2016-03-10 DIAGNOSIS — R0602 Shortness of breath: Secondary | ICD-10-CM | POA: Diagnosis present

## 2016-03-10 DIAGNOSIS — R600 Localized edema: Secondary | ICD-10-CM | POA: Diagnosis not present

## 2016-03-10 LAB — BASIC METABOLIC PANEL
Anion gap: 10 (ref 5–15)
BUN: 60 mg/dL — ABNORMAL HIGH (ref 6–20)
CALCIUM: 8.9 mg/dL (ref 8.9–10.3)
CO2: 23 mmol/L (ref 22–32)
CREATININE: 2.81 mg/dL — AB (ref 0.61–1.24)
Chloride: 103 mmol/L (ref 101–111)
GFR calc Af Amer: 21 mL/min — ABNORMAL LOW (ref >60)
GFR calc non Af Amer: 19 mL/min — ABNORMAL LOW (ref >60)
GLUCOSE: 95 mg/dL (ref 65–99)
Potassium: 4 mmol/L (ref 3.5–5.1)
Sodium: 136 mmol/L (ref 135–145)

## 2016-03-10 LAB — CBC
HEMATOCRIT: 26.4 % — AB (ref 39.0–52.0)
Hemoglobin: 8.5 g/dL — ABNORMAL LOW (ref 13.0–17.0)
MCH: 29.6 pg (ref 26.0–34.0)
MCHC: 32.2 g/dL (ref 30.0–36.0)
MCV: 92 fL (ref 78.0–100.0)
Platelets: 227 10*3/uL (ref 150–400)
RBC: 2.87 MIL/uL — ABNORMAL LOW (ref 4.22–5.81)
RDW: 15.6 % — AB (ref 11.5–15.5)
WBC: 7.2 10*3/uL (ref 4.0–10.5)

## 2016-03-10 LAB — URINALYSIS, ROUTINE W REFLEX MICROSCOPIC
Bilirubin Urine: NEGATIVE
Glucose, UA: NEGATIVE mg/dL
Ketones, ur: NEGATIVE mg/dL
Leukocytes, UA: NEGATIVE
Nitrite: NEGATIVE
Protein, ur: NEGATIVE mg/dL
Specific Gravity, Urine: 1.01 (ref 1.005–1.030)
pH: 5.5 (ref 5.0–8.0)

## 2016-03-10 LAB — BRAIN NATRIURETIC PEPTIDE: B Natriuretic Peptide: 741.5 pg/mL — ABNORMAL HIGH (ref 0.0–100.0)

## 2016-03-10 LAB — URINE MICROSCOPIC-ADD ON

## 2016-03-10 LAB — I-STAT TROPONIN, ED: Troponin i, poc: 0.02 ng/mL (ref 0.00–0.08)

## 2016-03-10 NOTE — ED Notes (Signed)
02 saturation 94%-88/84% while ambulating

## 2016-03-10 NOTE — ED Notes (Signed)
Patient's pain level not high now, but family reports last night his pain was excruciating.
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
## 2016-03-10 NOTE — ED Notes (Signed)
Patient with history of bladder cancer c/o increased SOB starting last night.  Patient has history of congestive heart failure.  Family reports patient has been sweating profusely.  Patient has refused admission to the hospital but daughter has power of attorney.  Patient's family also reports patient has dementia.  Patient is a DNR status.

## 2016-03-10 NOTE — Discharge Instructions (Signed)
Mr. FAUST DOCKSTADER,  Nice meeting you! Please follow-up with palliative care, your primary care provider, and your cardiologist. Return to the emergency department if you develop increased pain, new/worsening symptoms. Feel better soon!  S. Wendie Simmer, PA-C

## 2016-03-11 ENCOUNTER — Emergency Department (HOSPITAL_COMMUNITY)
Admission: EM | Admit: 2016-03-11 | Discharge: 2016-03-11 | Disposition: A | Discharge status: Home / Self Care | Payer: Commercial Managed Care - HMO | Attending: Emergency Medicine | Admitting: Emergency Medicine

## 2016-03-11 ENCOUNTER — Encounter (HOSPITAL_COMMUNITY): Payer: Self-pay

## 2016-03-11 ENCOUNTER — Emergency Department (HOSPITAL_COMMUNITY): Payer: Commercial Managed Care - HMO

## 2016-03-11 DIAGNOSIS — Z8546 Personal history of malignant neoplasm of prostate: Secondary | ICD-10-CM | POA: Insufficient documentation

## 2016-03-11 DIAGNOSIS — Z96641 Presence of right artificial hip joint: Secondary | ICD-10-CM | POA: Diagnosis not present

## 2016-03-11 DIAGNOSIS — C61 Malignant neoplasm of prostate: Secondary | ICD-10-CM | POA: Insufficient documentation

## 2016-03-11 DIAGNOSIS — Z87891 Personal history of nicotine dependence: Secondary | ICD-10-CM | POA: Diagnosis not present

## 2016-03-11 DIAGNOSIS — I509 Heart failure, unspecified: Secondary | ICD-10-CM | POA: Insufficient documentation

## 2016-03-11 DIAGNOSIS — Y939 Activity, unspecified: Secondary | ICD-10-CM | POA: Diagnosis not present

## 2016-03-11 DIAGNOSIS — M1990 Unspecified osteoarthritis, unspecified site: Secondary | ICD-10-CM | POA: Insufficient documentation

## 2016-03-11 DIAGNOSIS — W06XXXA Fall from bed, initial encounter: Secondary | ICD-10-CM | POA: Insufficient documentation

## 2016-03-11 DIAGNOSIS — S40012A Contusion of left shoulder, initial encounter: Secondary | ICD-10-CM | POA: Insufficient documentation

## 2016-03-11 DIAGNOSIS — S4992XA Unspecified injury of left shoulder and upper arm, initial encounter: Secondary | ICD-10-CM | POA: Diagnosis present

## 2016-03-11 DIAGNOSIS — Y92009 Unspecified place in unspecified non-institutional (private) residence as the place of occurrence of the external cause: Secondary | ICD-10-CM | POA: Insufficient documentation

## 2016-03-11 DIAGNOSIS — Y999 Unspecified external cause status: Secondary | ICD-10-CM | POA: Insufficient documentation

## 2016-03-11 HISTORY — DX: Malignant neoplasm of prostate: C61

## 2016-03-11 MED ORDER — HYDROCODONE-ACETAMINOPHEN 5-325 MG PO TABS
1.0000 | ORAL_TABLET | Freq: Once | ORAL | Status: AC
  Administered 2016-03-11: 1 via ORAL
  Filled 2016-03-11: qty 1

## 2016-03-11 MED ORDER — TRAMADOL HCL 50 MG PO TABS: 50.0000 mg | ORAL_TABLET | Freq: Four times a day (QID) | ORAL | Status: AC | PRN

## 2016-03-11 NOTE — ED Notes (Signed)
Just p.t.a. He fell at home upon attempting to sit on his bed.  He c/o left humerus area pain.  CMS intact all fingers bilat.
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
## 2016-03-11 NOTE — ED Notes (Signed)
Bed: [id number] Expected date:  Expected time:  Means of arrival:  Comments: EMS- fall; shoulder pain

## 2016-03-11 NOTE — Discharge Instructions (Signed)
See the Orthopedic doctor in 1-2 weeks. Keep the arm in the sling. Ice the area 4 times a day for 10 min   Contusion A contusion is a deep bruise. Contusions are the result of a blunt injury to tissues and muscle fibers under the skin. The injury causes bleeding under the skin. The skin overlying the contusion may turn blue, purple, or yellow. Minor injuries will give you a painless contusion, but more severe contusions may stay painful and swollen for a few weeks.  CAUSES  This condition is usually caused by a blow, trauma, or direct force to an area of the body. SYMPTOMS  Symptoms of this condition include:  Swelling of the injured area.  Pain and tenderness in the injured area.  Discoloration. The area may have redness and then turn blue, purple, or yellow. DIAGNOSIS  This condition is diagnosed based on a physical exam and medical history. An X-ray, CT scan, or MRI may be needed to determine if there are any associated injuries, such as broken bones (fractures). TREATMENT  Specific treatment for this condition depends on what area of the body was injured. In general, the best treatment for a contusion is resting, icing, applying pressure to (compression), and elevating the injured area. This is often called the RICE strategy. Over-the-counter anti-inflammatory medicines may also be recommended for pain control.  HOME CARE INSTRUCTIONS   Rest the injured area.  If directed, apply ice to the injured area:  Put ice in a plastic bag.  Place a towel between your skin and the bag.  Leave the ice on for 20 minutes, 2-3 times per day.  If directed, apply light compression to the injured area using an elastic bandage. Make sure the bandage is not wrapped too tightly. Remove and reapply the bandage as directed by your health care provider.  If possible, raise (elevate) the injured area above the level of your heart while you are sitting or lying down.  Take over-the-counter and  prescription medicines only as told by your health care provider. SEEK MEDICAL CARE IF:  Your symptoms do not improve after several days of treatment.  Your symptoms get worse.  You have difficulty moving the injured area. SEEK IMMEDIATE MEDICAL CARE IF:   You have severe pain.  You have numbness in a hand or foot.  Your hand or foot turns pale or cold.   This information is not intended to replace advice given to you by your health care provider. Make sure you discuss any questions you have with your health care provider.   Document Released: 07/04/2005 Document Revised: 06/15/2015 Document Reviewed: 02/09/2015 Elsevier Interactive Patient Education 2016 [place] for Routine Care of Injuries Theroutine careofmanyinjuriesincludes rest, ice, compression, and elevation (RICE therapy). RICE therapy is often recommended for injuries to soft tissues, such as a muscle strain, ligament injuries, bruises, and overuse injuries. It can also be used for some bony injuries. Using RICE therapy can help to relieve pain, lessen swelling, and enable your body to heal. Rest Rest is required to allow your body to heal. This usually involves reducing your normal activities and avoiding use of the injured part of your body. Generally, you can return to your normal activities when you are comfortable and have been given permission by your health care provider. Ice Icing your injury helps to keep the swelling down, and it lessens pain. Do not apply ice directly to your skin.  Put ice in a plastic bag.  Place a towel  between your skin and the bag.  Leave the ice on for 20 minutes, 2-3 times a day. Do this for as long as you are directed by your health care provider. Compression Compression means putting pressure on the injured area. Compression helps to keep swelling down, gives support, and helps with discomfort. Compression may be done with an elastic bandage. If an elastic bandage has  been applied, follow these general tips:  Remove and reapply the bandage every 3-4 hours or as directed by your health care provider.  Make sure the bandage is not wrapped too tightly, because this can cut off circulation. If part of your body beyond the bandage becomes blue, numb, cold, swollen, or more painful, your bandage is most likely too tight. If this occurs, remove your bandage and reapply it more loosely.  See your health care provider if the bandage seems to be making your problems worse rather than better. Elevation Elevation means keeping the injured area raised. This helps to lessen swelling and decrease pain. If possible, your injured area should be elevated at or above the level of your heart or the center of your chest. [place]? You should seek medical care if:  Your pain and swelling continue.  Your symptoms are getting worse rather than improving. These symptoms may indicate that further evaluation or further X-rays are needed. Sometimes, X-rays may not show a small broken bone (fracture) until a number of days later. Make a follow-up appointment with your health care provider. WHEN SHOULD I SEEK IMMEDIATE MEDICAL CARE? You should seek immediate medical care if:  You have sudden severe pain at or below the area of your injury.  You have redness or increased swelling around your injury.  You have tingling or numbness at or below the area of your injury that does not improve after you remove the elastic bandage.   This information is not intended to replace advice given to you by your health care provider. Make sure you discuss any questions you have with your health care provider.   Document Released: 01/06/2001 Document Revised: 06/15/2015 Document Reviewed: 09/01/2014 Elsevier Interactive Patient Education [employer].

## 2016-03-12 ENCOUNTER — Telehealth: Payer: Self-pay | Admitting: Cardiology

## 2016-03-12 ENCOUNTER — Telehealth: Payer: Self-pay | Admitting: Internal Medicine

## 2016-03-12 NOTE — Telephone Encounter (Signed)
The pt has been referred to hospice by ed md. Pt family would like dr Regis Bill to be the attending physician. Please call hospice

## 2016-03-12 NOTE — Telephone Encounter (Signed)
Pt discharged from [facility] ER on Saturday to [place].Hospice wants to know if Dr Martinique will be his attending doctor for Hospice?

## 2016-03-12 NOTE — Telephone Encounter (Signed)
See previous 03/12/16 note.

## 2016-03-12 NOTE — Telephone Encounter (Signed)
Returned call to [place] with Hospice.Dr.Jordan advised he will be Hospice physician.

## 2016-03-12 NOTE — Telephone Encounter (Signed)
I can serve as his Hospice physician  Peter Martinique MD, [facility]

## 2016-03-12 NOTE — Telephone Encounter (Signed)
New message    The Hospice was calling concerning the question why ?Dr. Martinique did not want to be the pt provider at hospice.

## 2016-03-12 NOTE — Telephone Encounter (Signed)
Returned call to [place] with Hospice.She stated family has requested Dr.Jordan be his attending Dr.for Hospice.Advised to call PCP.She stated PCP Dr.Panosh out of office all week.Stated she wanted me to check with Dr.Jordan.Message sent to [place].

## 2016-03-15 NOTE — Telephone Encounter (Signed)
Spoke to [place].  She stated that cardiology has agreed to be attending.  No further action needed at this time.

## 2016-03-15 NOTE — Telephone Encounter (Signed)
ED NOTE said palliative care  OK to do referral palliatic and or hospice  Would need  HOSPICE clinicians to help with  pain management  Otherwise  I can accept being attending

## 2016-03-19 ENCOUNTER — Telehealth: Payer: Self-pay | Admitting: Cardiology

## 2016-03-19 NOTE — Telephone Encounter (Signed)
New message      *STAT* If patient is at the pharmacy, call can be transferred to refill team.   1. Which medications need to be refilled? (please list name of each medication and dose if known) Tramadol 50 mg po as needed for pain every 6 hours  2. Which pharmacy/location (including street and city if local pharmacy) is medication to be sent to?[place] on [address]  3. Do they need a 30 day or 90 day supply? 30 day supply   The hospice would like to informed when the medication is called in

## 2016-03-19 NOTE — Telephone Encounter (Signed)
Abigail Butts called back and said the doctor at [facility] would take care of the Tramadol prescription.

## 2016-03-20 ENCOUNTER — Telehealth: Payer: Self-pay | Admitting: Cardiology

## 2016-03-20 ENCOUNTER — Other Ambulatory Visit: Payer: Commercial Managed Care - HMO

## 2016-03-20 NOTE — Telephone Encounter (Signed)
New message    FYI form the hospice nurse, up date on the pt, the pt appears to have a study decline weaker this week than last the pt is ambulating shorter distances increase respiratory  rate, the pt seems to accept the illness more this week   The nurse used to hospice stand care used some Haldol to manage the agitated outburst the hospice nurse seems to think it is helping, No need to call the hospice nurse [place] less the Md has other suggestion.

## 2016-03-20 NOTE — Telephone Encounter (Signed)
Message sent to Dr. Jordan for review. 

## 2016-03-27 ENCOUNTER — Telehealth: Payer: Self-pay | Admitting: Cardiology

## 2016-03-27 NOTE — Telephone Encounter (Signed)
New Message  RN from [facility] calling to get verbal order for pt to be tx to [facility] place for 'end of life' care. Please call back and discuss.

## 2016-03-27 NOTE — Telephone Encounter (Signed)
Returned call to [place] with Hospice.She stated patient's condition has declined.Stated she needed a order to transfer care to [facility].Advised ok to transfer.I will let Dr.Jordan know.

## 2016-03-28 NOTE — Telephone Encounter (Signed)
  Agree, ok to transfer to [facility]  Peter Martinique MD, [facility]

## 2016-04-05 ENCOUNTER — Telehealth: Payer: Self-pay | Admitting: Cardiology

## 2016-04-05 NOTE — Telephone Encounter (Signed)
Received Death Certificate via Fax from [employer] @ [place] to [place], [place] for Dr Martinique to sign on 03/06/16.  Original is being sent via [employer] from [facility] OK to sign faxed copy until Original received.  Dr Martinique in office 04/06/16. lp

## 2016-04-05 NOTE — Telephone Encounter (Signed)
Death Certificate received from [employer]-faxed copy to [employer] aware coming [place] they are aware the faxed copy will get signed Friday 04/06/16 and the original will get signed  Thursday 04/12/16.

## 2016-04-07 DEATH — deceased

## 2016-05-11 ENCOUNTER — Ambulatory Visit: Payer: Commercial Managed Care - HMO | Admitting: Cardiology

## 2017-06-04 IMAGING — CR DG ANKLE COMPLETE 3+V*R*
3 series · 3 of 3 positions shown · non-contrast
Comparison: Right foot series of today's date

CLINICAL DATA: One week of lateral ankle pain and swelling
extending into the posterior foot without known injury

EXAM:
RIGHT ANKLE - COMPLETE 3+ VIEW

[view not recorded (1 of 3)]
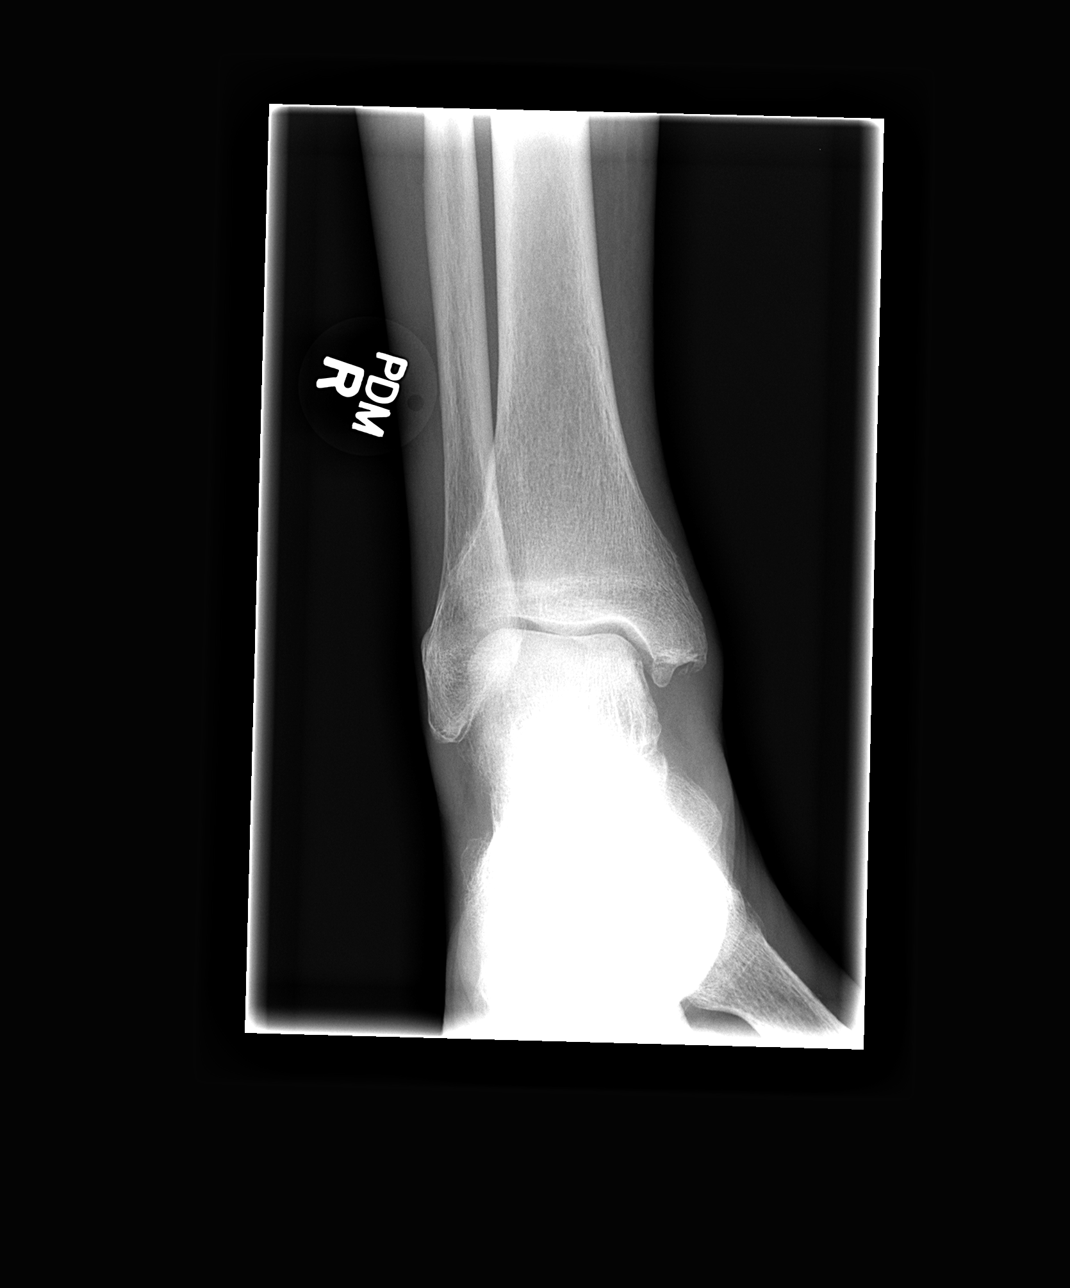

[view not recorded (2 of 3)]
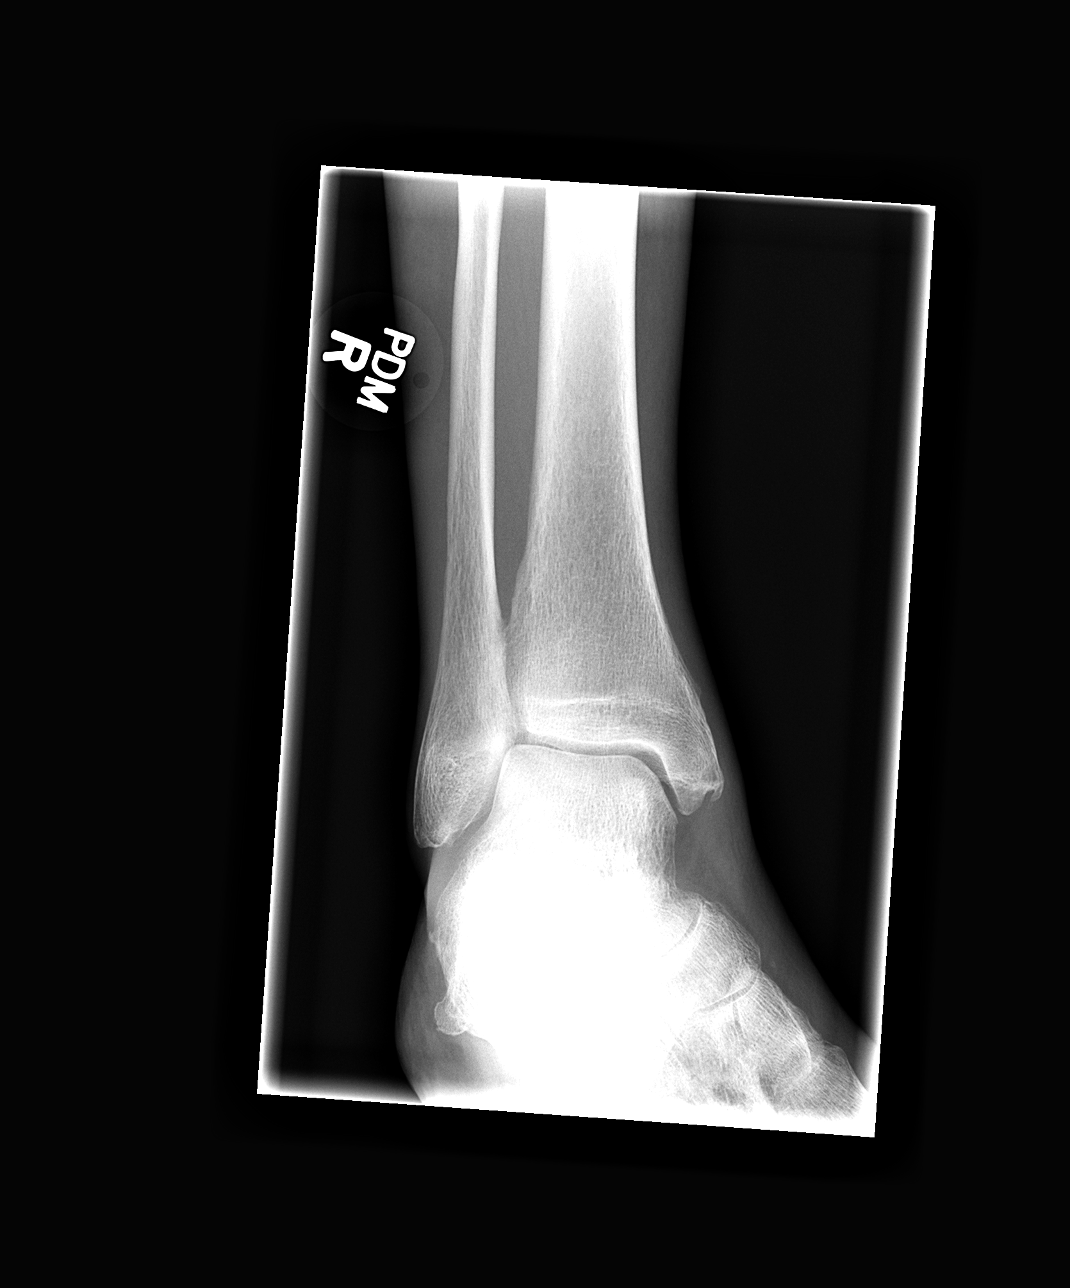

[view not recorded (3 of 3)]
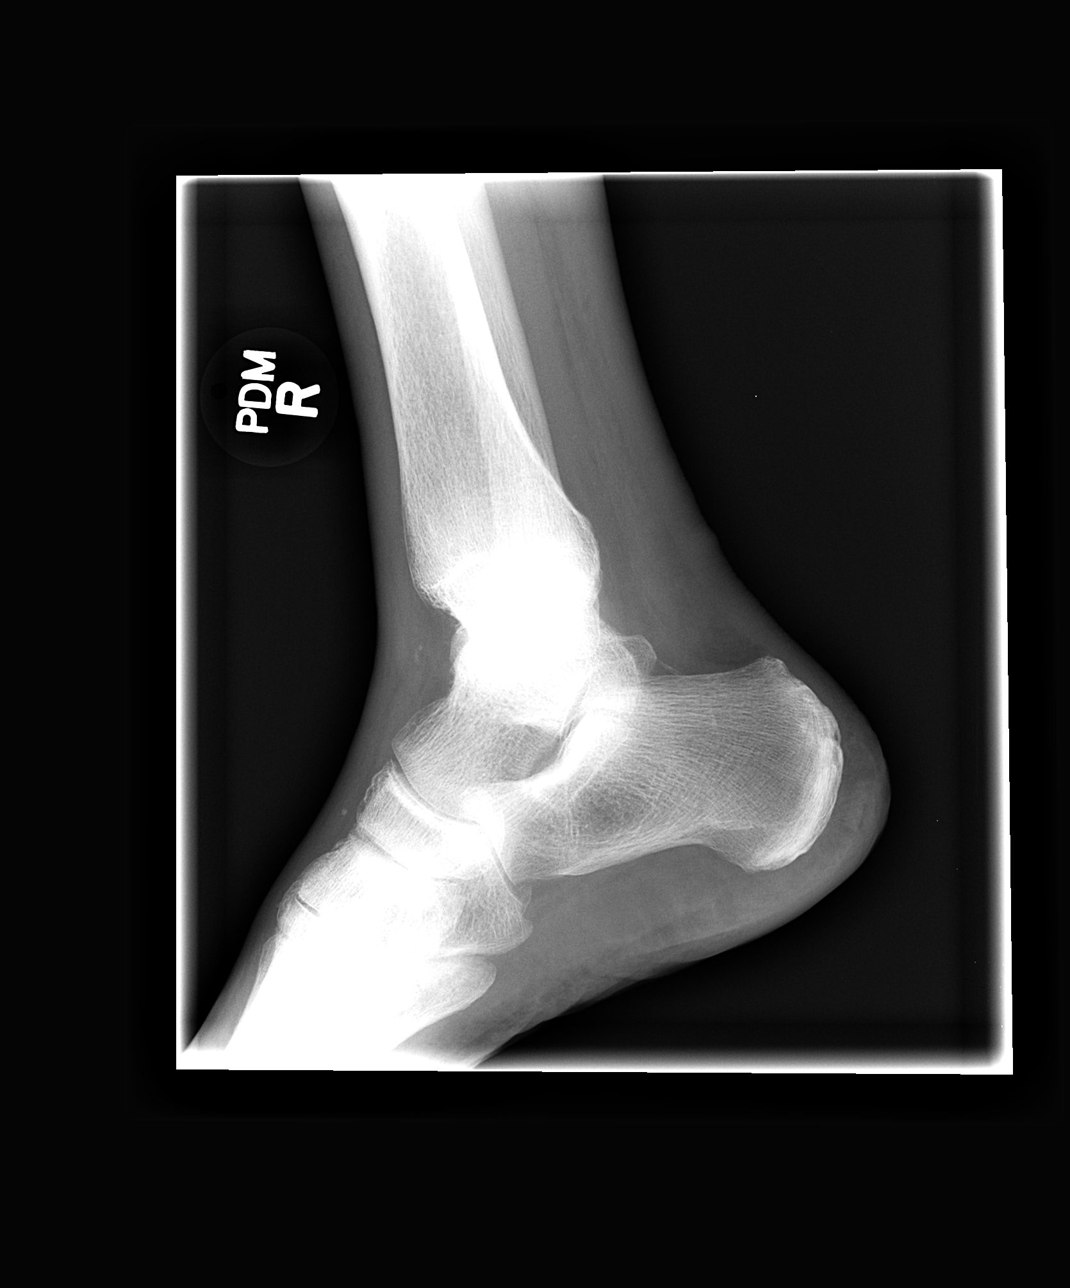

[3 of 3 positions shown; findings below may reference images not displayed]

FINDINGS: The bones of the ankle are adequately mineralized for age. The joint
mortise is preserved. The talar dome is intact. There tiny spurs
arising from the tips of the medial and lateral malleoli. The talus
and calcaneus exhibit no abnormalities. The soft tissues are
unremarkable.
IMPRESSION: There is no acute bony abnormality of the right ankle.

## 2018-02-18 IMAGING — CR DG CHEST 2V
2 series · 2 of 2 positions shown · non-contrast
Comparison: 08/03/2015 CT abdomen

CLINICAL DATA: Intermittent shortness of breath over the past 2
days with swelling of the legs.

EXAM:
CHEST  2 VIEW

[w chest lat]
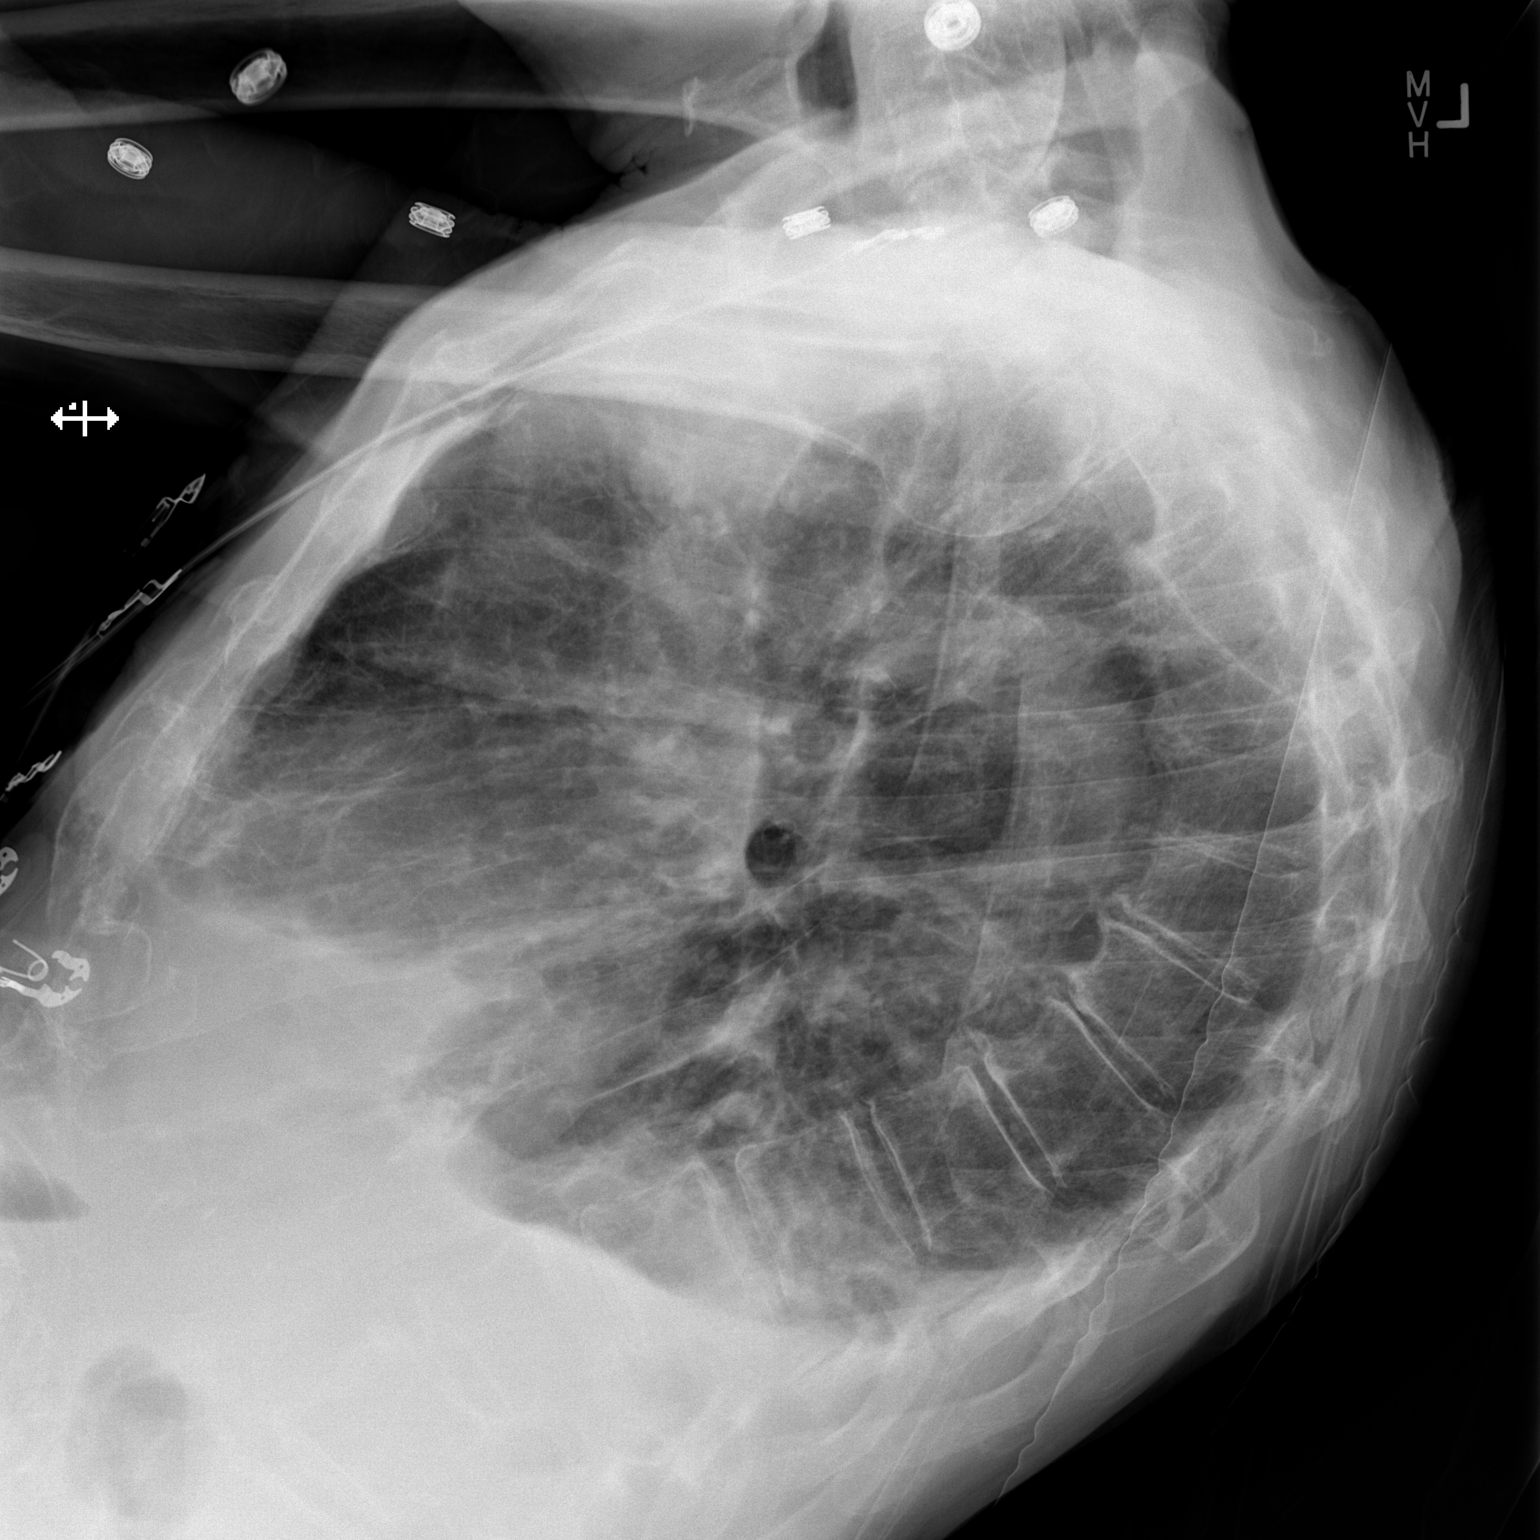

[x chest ap]
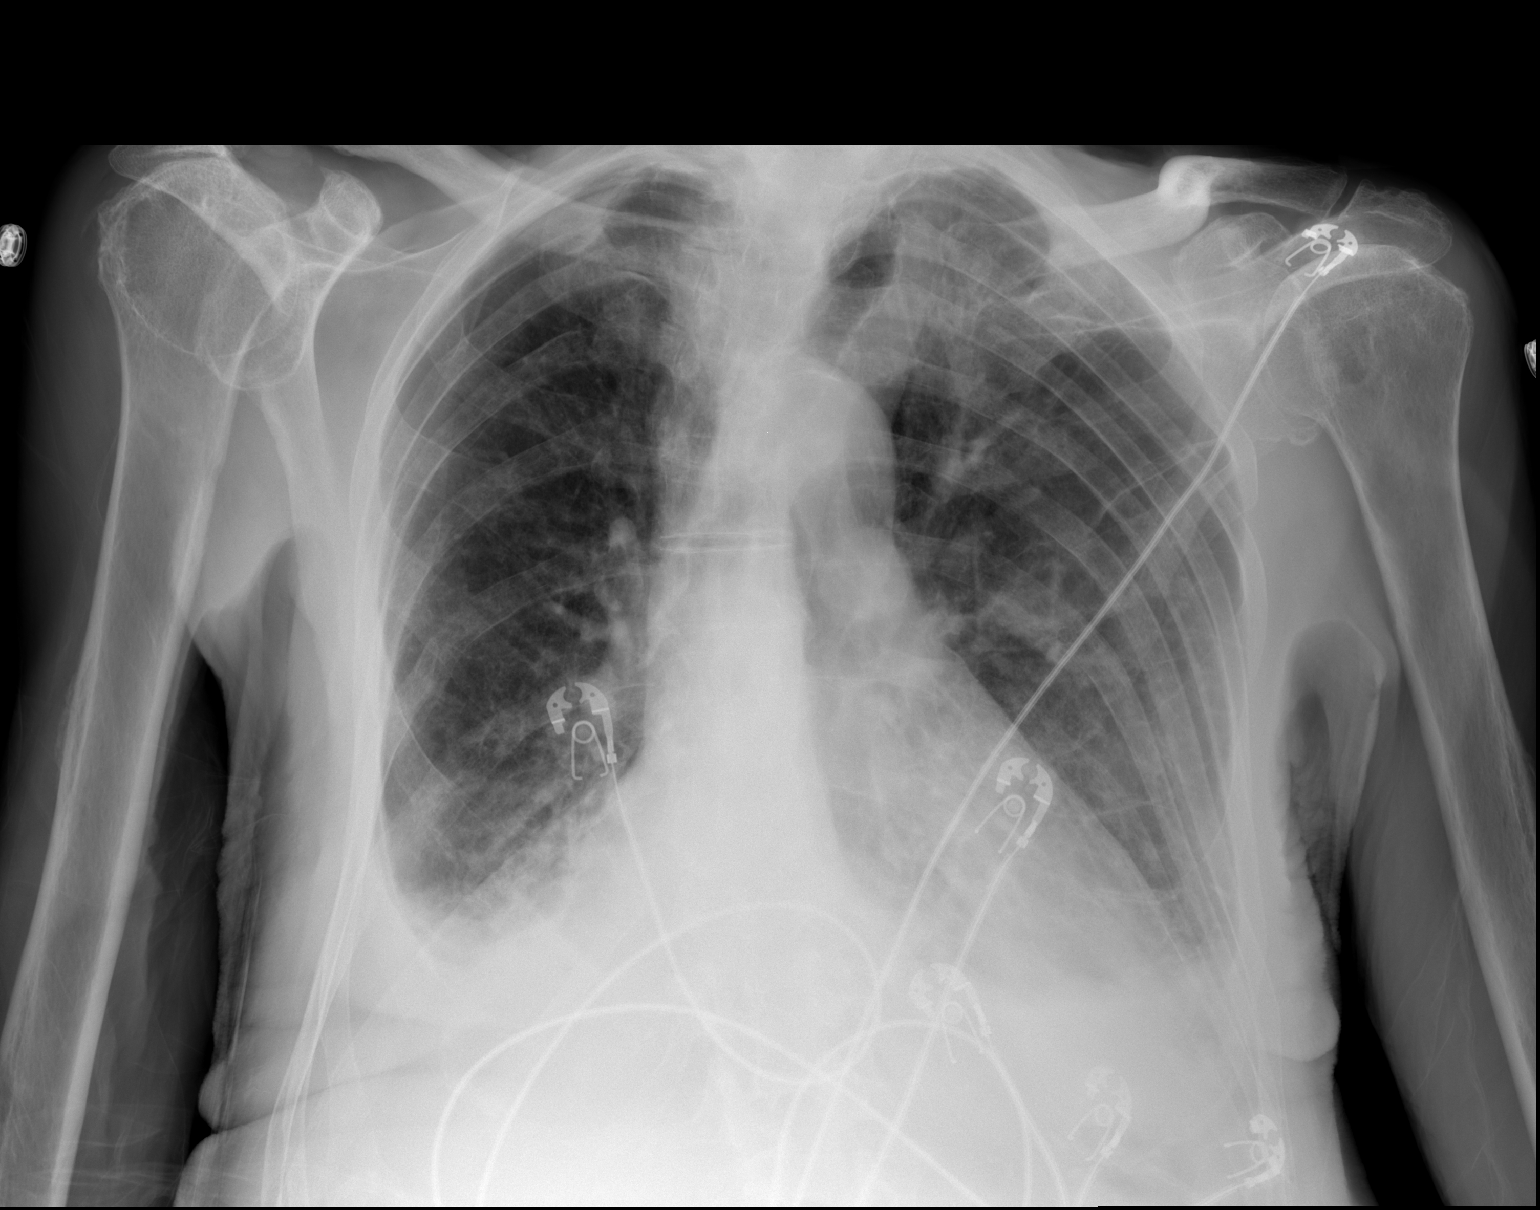

[2 of 2 positions shown; findings below may reference images not displayed]

FINDINGS: Emphysema. Small to moderate bilateral pleural effusions with
passive atelectasis. Mild cardiomegaly.

Thoracic spondylosis and kyphosis.

The patient is rotated to the left on today's radiograph, reducing
diagnostic sensitivity and specificity. Right posterolateral rib
fractures, age indeterminate. Scattered Kerley B lines on the left.
IMPRESSION: 1. Mild enlargement of the cardiopericardial silhouette with
left-sided Kerley B-lines suspicious for mild interstitial edema.
2. Mild to moderate bilateral pleural effusions with passive
atelectasis.
3. Atherosclerotic aortic arch.
4. Thoracic kyphosis.
5. Emphysema.

## 2018-02-18 IMAGING — CT CT ABD-PELV W/ CM
2 of 5 series · 12 of 36 positions shown, 15 images · IV contrast (ISOVUE)
Comparison: CT abdomen and pelvis 08/03/2015

CLINICAL DATA: Bilateral lower extremity edema. Edema for 5-6
weeks. Elevated PSA. Shortness of breath.

EXAM:
CT CHEST, ABDOMEN, AND PELVIS WITH CONTRAST
TECHNIQUE: Multidetector CT imaging of the chest, abdomen and pelvis was
performed following the standard protocol during bolus
administration of intravenous contrast.
CONTRAST:  80mL OSPSLI-4PP IOPAMIDOL (OSPSLI-4PP) INJECTION 61%

[Series 2: c/a/p with · axial · 0.75mm/px · z∈[-454,+40]mm · 9 of 122 slices shown, 12 images]
[im 12/122  mediastinal]
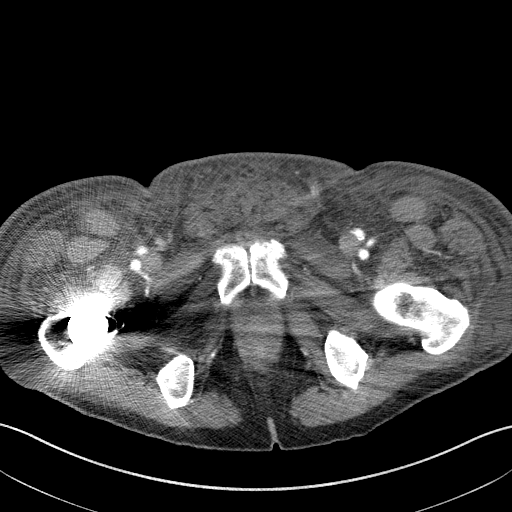
[im 12/122  lung]
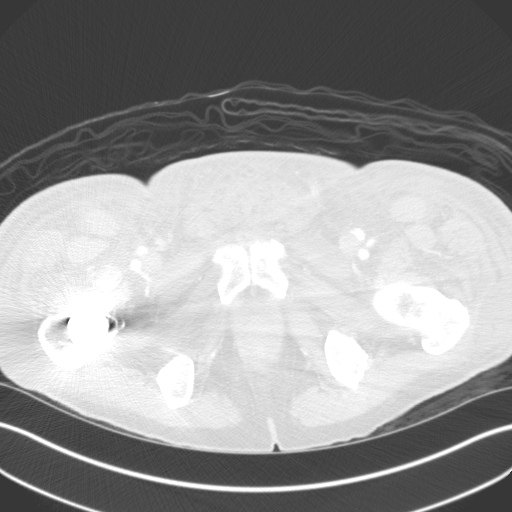
[im 23/122  lung]
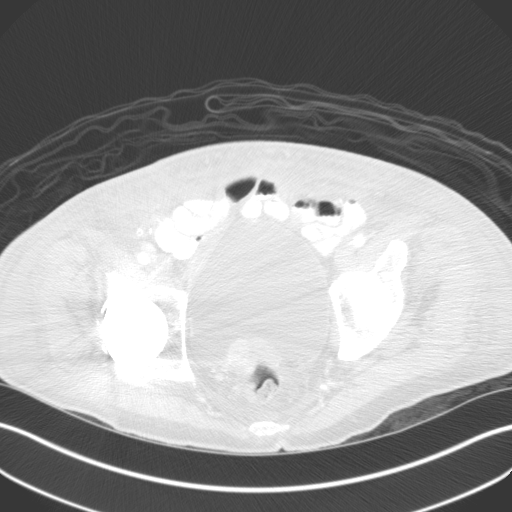
[im 34/122  lung]
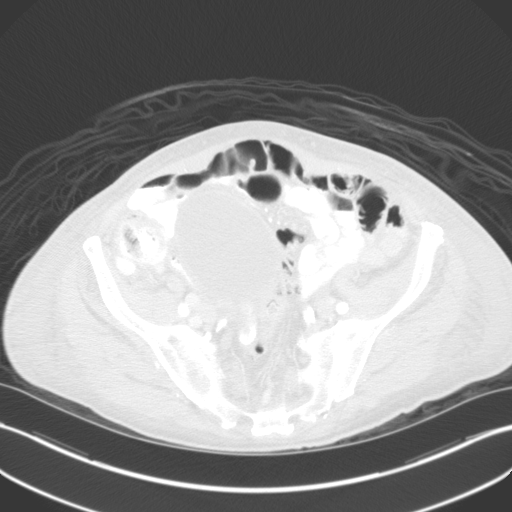
[im 45/122  lung]
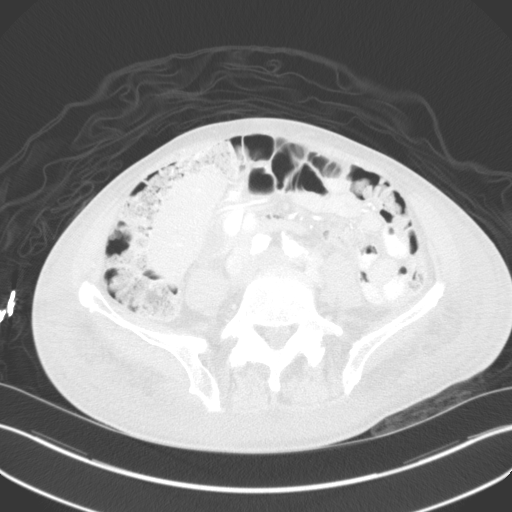
[im 67/122  mediastinal]
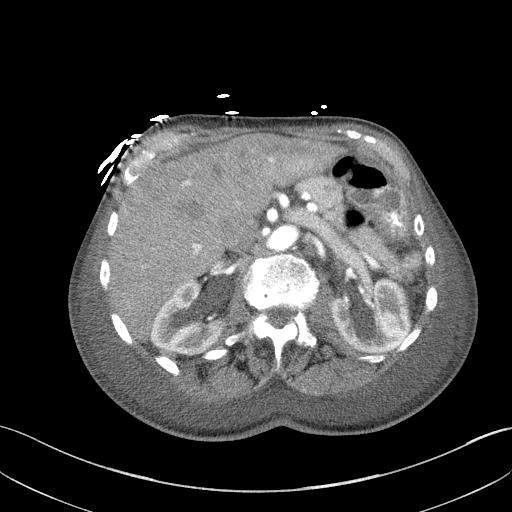
[im 67/122  lung]
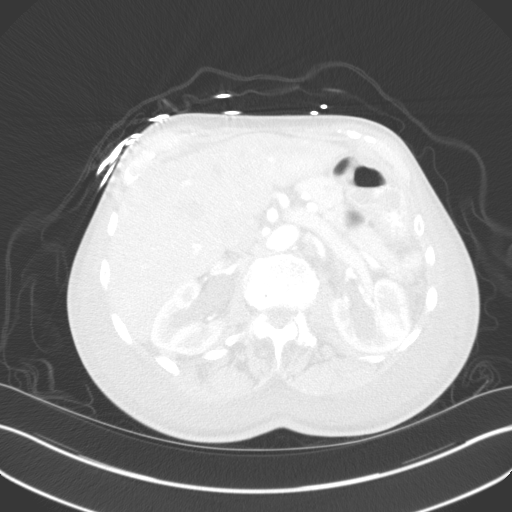
[im 78/122  lung]
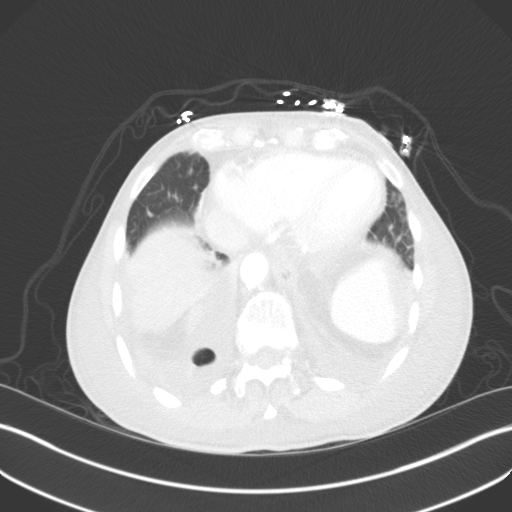
[im 89/122  lung]
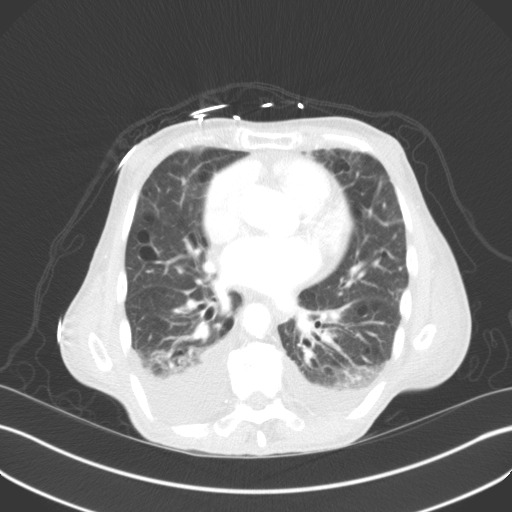
[im 100/122  lung]
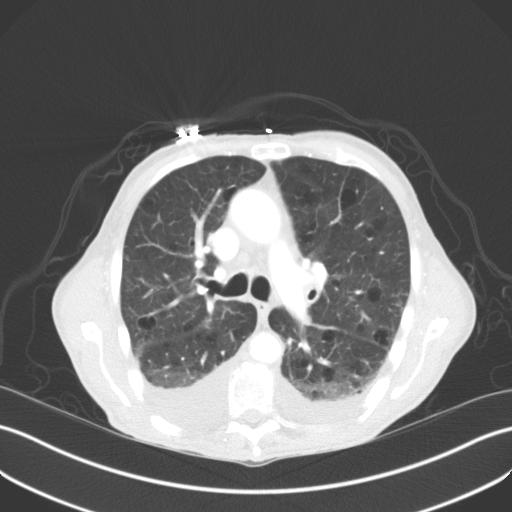
[im 111/122  mediastinal]
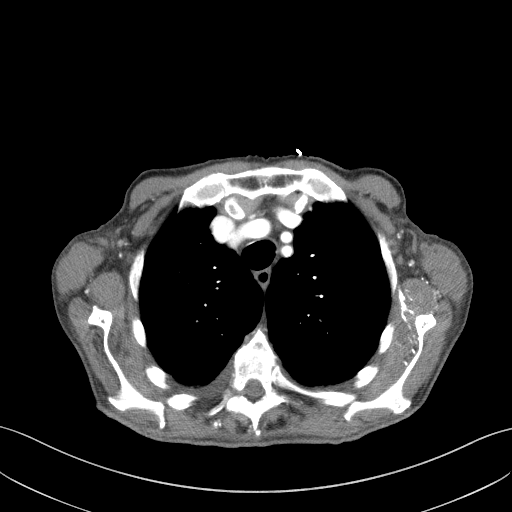
[im 111/122  lung]
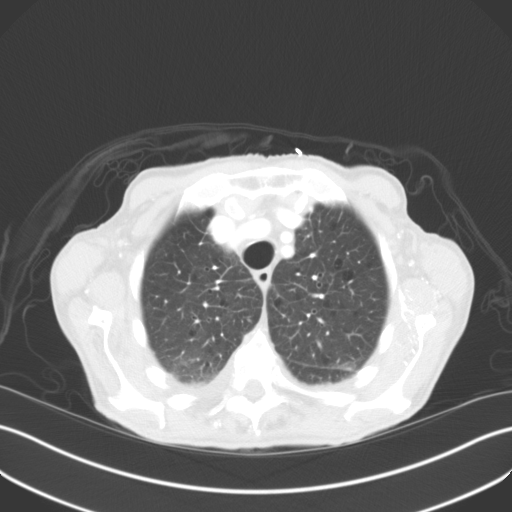

[Series 6: coronal · coronal · 0.70mm/px · 3 of 126 slices shown]
[im 26/126  lung]
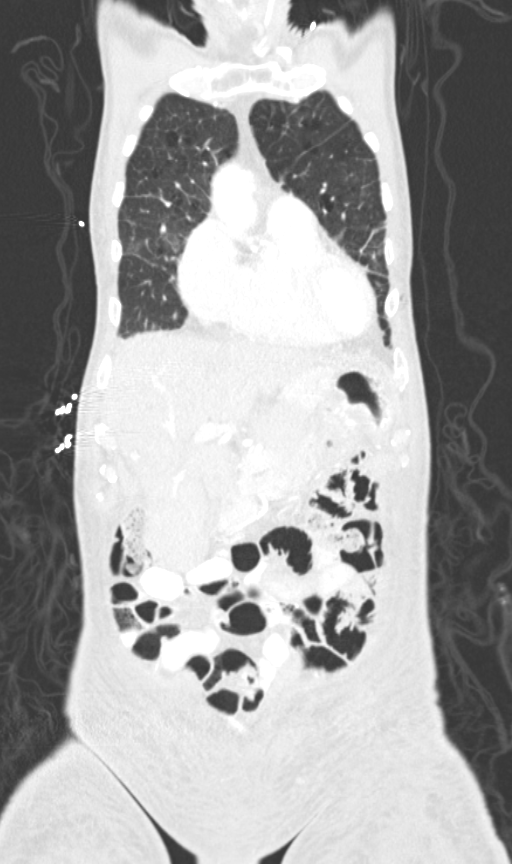
[im 51/126  lung]
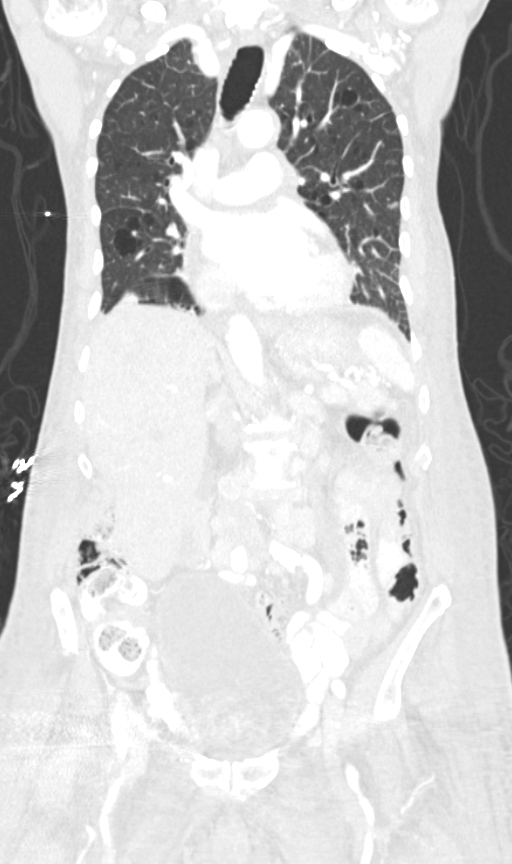
[im 76/126  lung]
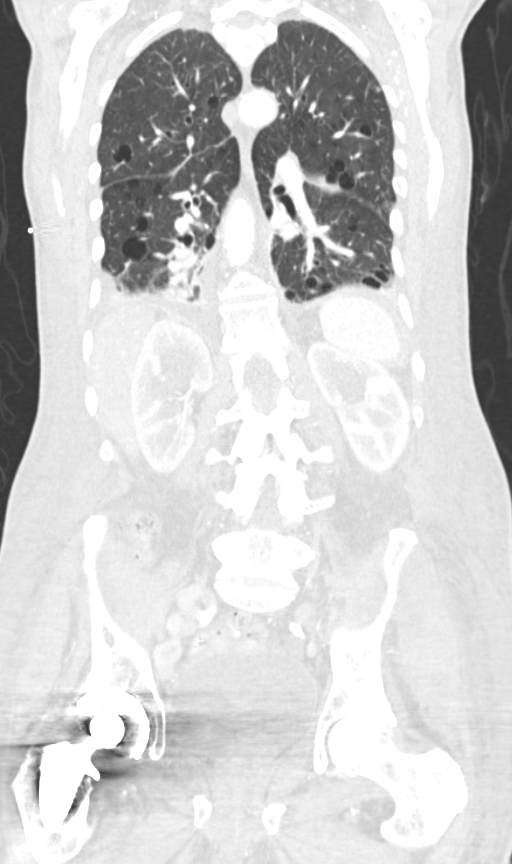

[12 of 36 positions shown; findings below may reference images not displayed]

FINDINGS: CT CHEST

Mild cardiac enlargement. Normal caliber thoracic aorta. No aortic
aneurysm or dissection. Great vessel origins are patent.
Calcification of the aorta and coronary arteries. Calcification in
the mitral valve annulus. Moderately good opacification of the
central pulmonary arteries without evidence of large pulmonary
embolus. Esophagus is decompressed. No significant lymphadenopathy
in the chest.

Small to moderate-sized bilateral pleural effusions with atelectasis
in the lung bases. Interstitial thickening likely represents
interstitial edema. Diffuse emphysematous changes in the lungs. Mild
central bronchiectasis. No pneumothorax.

CT ABDOMEN AND PELVIS

There is diffuse edema throughout the subcutaneous fat of the
abdomen and pelvis. The liver, spleen, gallbladder, pancreas,
adrenal glands, and inferior vena cava are unremarkable. There is
moderately prominent retroperitoneal lymphadenopathy, with
periaortic lymph nodes measuring up to about 2.9 cm diameter.
Similar changes are seen on the previous study and the appearance is
worrisome for metastatic lymphadenopathy or lymphoproliferative
disorder. Stomach, small bowel, and colon are mostly decompressed.
Contrast material does flow through to the colon suggesting no
evidence of bowel obstruction. There appears to been progressive
loss of abdominal fat since the previous study. No free air or free
fluid in the abdomen.

Pelvis: The bladder is distended without wall thickening, suggesting
outlet obstruction. Prostate gland is enlarged with somewhat nodular
appearance. Prostate gland is measured at about 4 cm diameter.
Nodular impression of the prostate on to the bladder base.
Asymmetric wall thickening in the posterior bladder suggesting
bladder neoplasm. Bilateral renal hydronephrosis and hydroureter,
new since previous study. This may represent extrinsic compression
of the ureters. Reflux due to outlet obstruction could also have
this appearance. Prominent lymph nodes are demonstrated in the iliac
chains bilaterally. Evaluation of the low pelvis is limited due
streak artifact from right hip arthroplasty.

Degenerative changes in the spine. Degenerative changes also
demonstrated in the shoulders. Suggestion of the lucent lesion in
the right humeral head, incompletely included within the field of
view of this study. No other focal bone lesions suggested.
IMPRESSION: Mild cardiac enlargement with bilateral pleural effusions and
interstitial edema. Atelectasis at the lung bases. Central
bronchiectasis. Pulmonary emphysema.

Diffuse edema throughout the subcutaneous fat of the abdomen and
pelvis. Prominent retroperitoneal and pelvic lymphadenopathy,
worrisome for metastasis. Bladder distention suggesting bladder
outlet obstruction. Enlarged prostate gland. Asymmetric wall
thickening in the posterior aspect of the bladder suggesting bladder
neoplasm. Bilateral hydronephrosis and hydroureter may indicate
reflux due to eyelid obstruction or extrinsic compression of the
ureters.

Possible destructive bone lesion in the right humeral head.
